# Patient Record
Sex: Female | Born: 2000 | Race: Asian | Hispanic: No | Marital: Single | Smoking: Never smoker
Health system: Southern US, Community
[De-identification: ages and names within clinical notes are randomized; demographics above are authoritative.]

## PROBLEM LIST (undated history)

## (undated) DIAGNOSIS — O24419 Gestational diabetes mellitus in pregnancy, unspecified control: Secondary | ICD-10-CM

## (undated) DIAGNOSIS — Z789 Other specified health status: Secondary | ICD-10-CM

## (undated) HISTORY — DX: Gestational diabetes mellitus in pregnancy, unspecified control: O24.419

## (undated) HISTORY — PX: NO PAST SURGERIES: SHX2092

---
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
## 2020-04-04 ENCOUNTER — Other Ambulatory Visit: Payer: Self-pay | Admitting: Physician Assistant

## 2020-10-04 IMAGING — US OBSTETRIC <14 WK US AND TRANSVAGINAL OB US
1 series · 15 of 28 positions shown · non-contrast
Comparison: None.

CLINICAL DATA: Heavy bleeding

EXAM:
OBSTETRIC <14 WK US AND TRANSVAGINAL OB US
TECHNIQUE: Both transabdominal and transvaginal ultrasound examinations were
performed for complete evaluation of the gestation as well as the
maternal uterus, adnexal regions, and pelvic cul-de-sac.
Transvaginal technique was performed to assess early pregnancy.

[Series 1: obstetric <14 wk us and transvaginal ob us · 74 acquisitions, 15 frames shown]
[im 1/74]
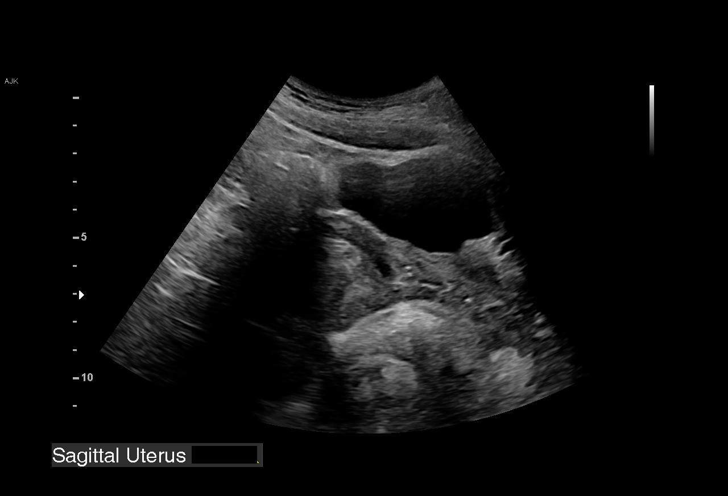
[im 6/74]
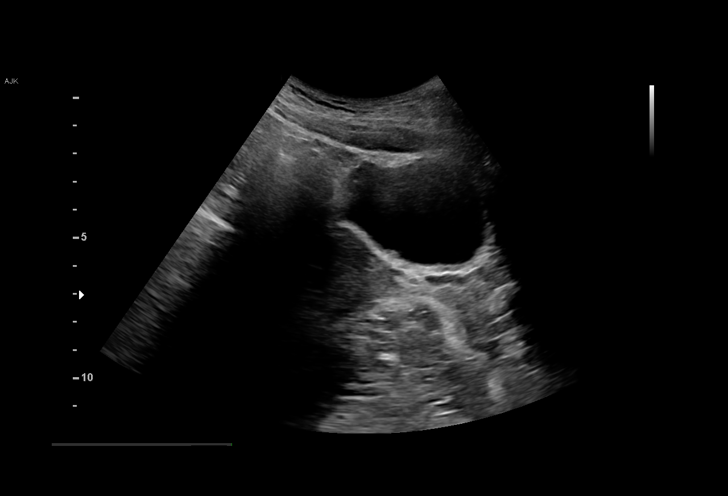
[im 11/74]
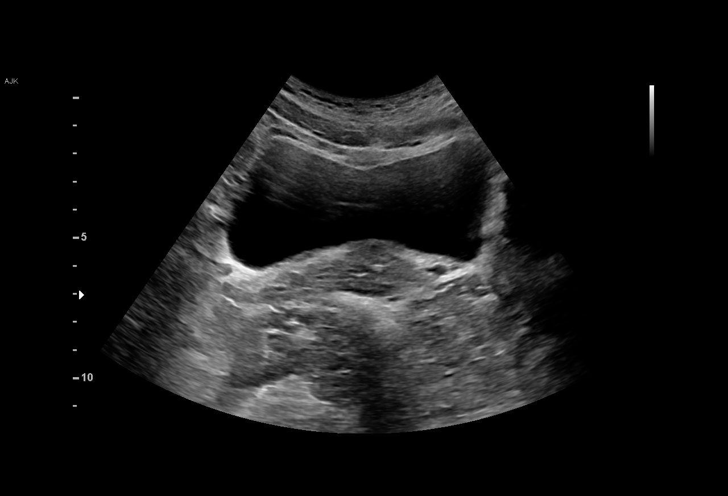
[im 17/74]
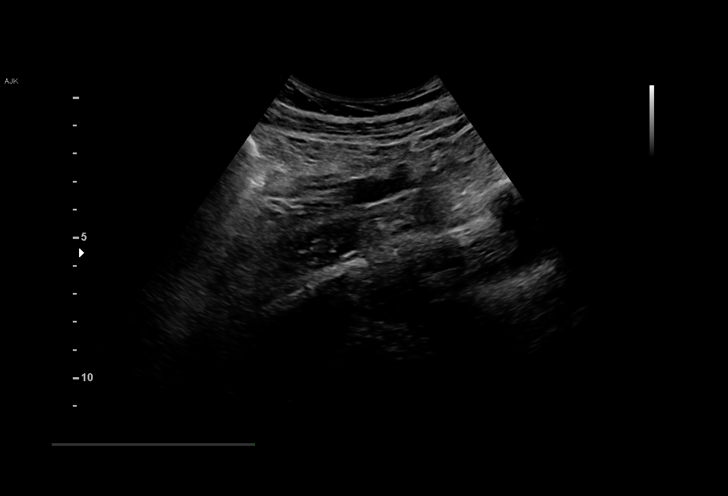
[im 22/74]
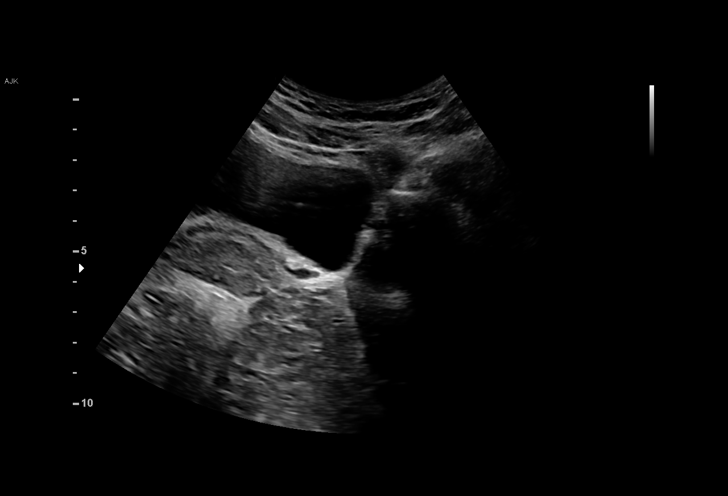
[im 28/74]
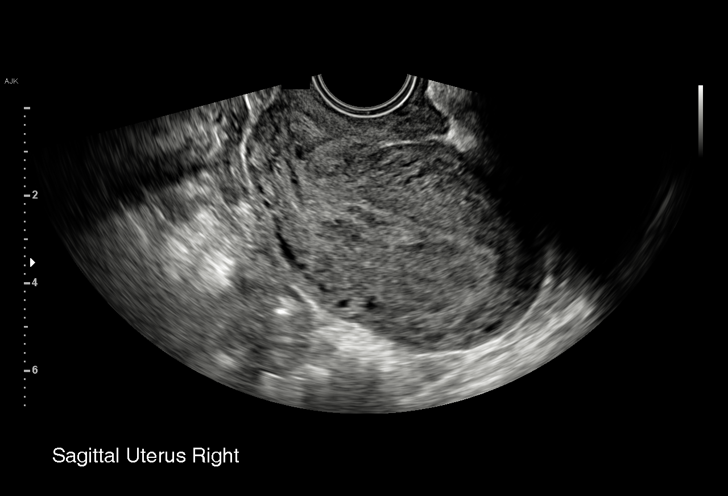
[im 33/74]
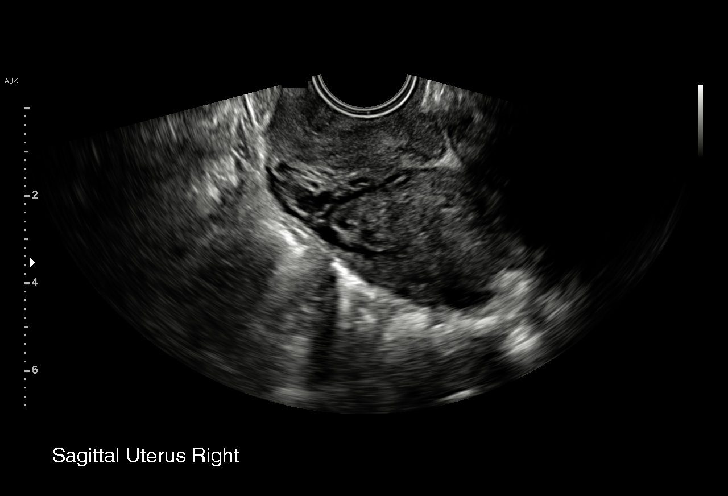
[im 38/74]
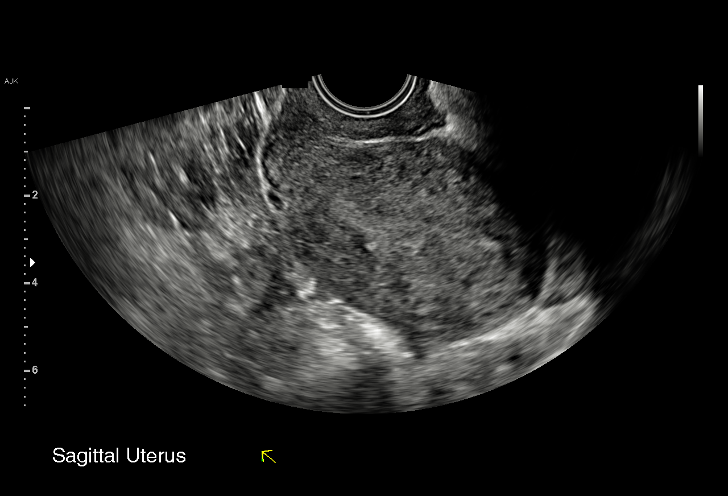
[im 41/74]
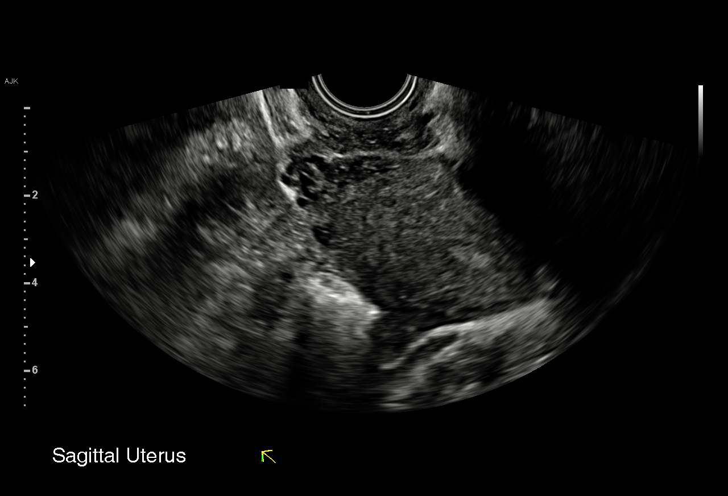
[im 46/74]
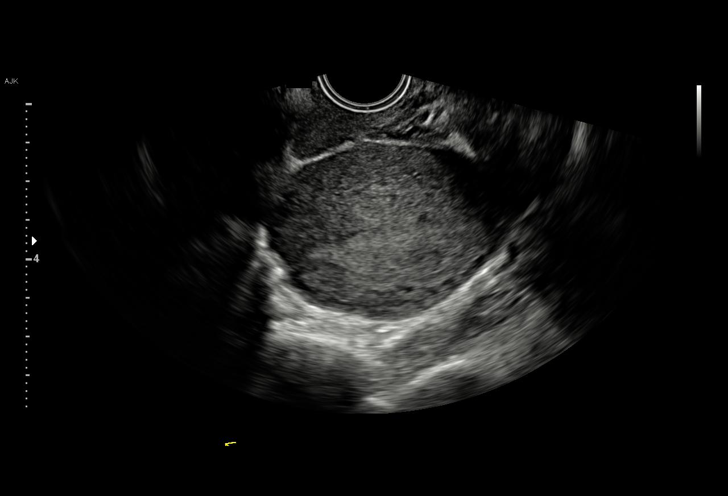
[im 52/74]
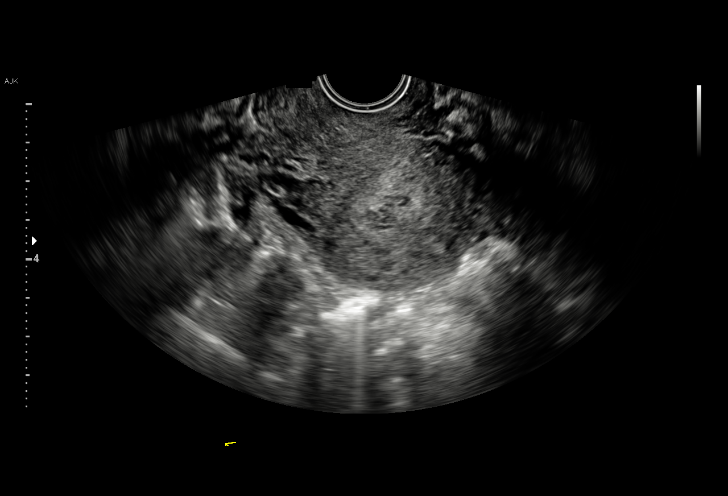
[im 57/74]
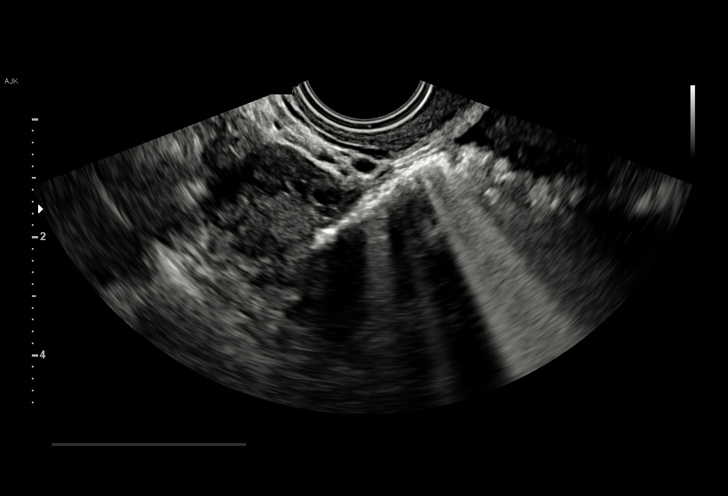
[im 63/74]
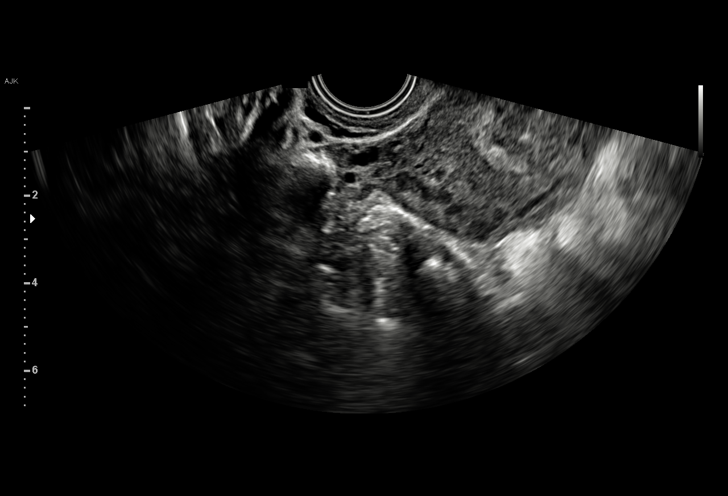
[im 68/74]
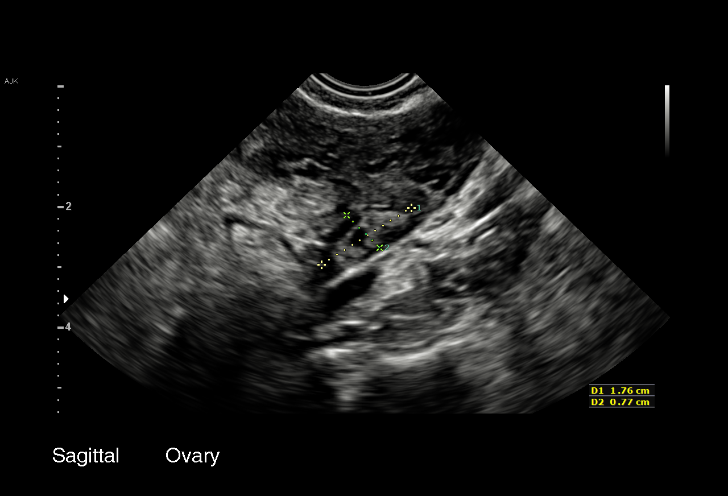
[im 74/74]
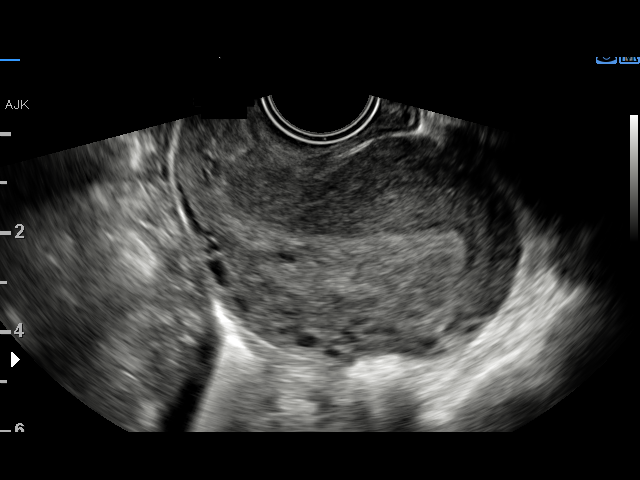

[15 of 28 positions shown; findings below may reference images not displayed]

FINDINGS: Intrauterine gestational sac: Absent

Maternal uterus/adnexae: Uterus is prominent with thickened
endometrium. Ovaries appear within normal limits.
IMPRESSION: No intrauterine gestation identified. Correlation with beta HCG
level is recommended.

## 2023-01-14 ENCOUNTER — Other Ambulatory Visit: Payer: Self-pay

## 2023-01-14 ENCOUNTER — Encounter: Payer: Self-pay | Admitting: Family Medicine

## 2023-01-14 ENCOUNTER — Ambulatory Visit: Payer: Medicaid Other | Admitting: Certified Nurse Midwife

## 2023-01-14 VITALS — BP 105/65 | HR 60 | Ht 60.0 in | Wt 151.0 lb

## 2023-01-14 DIAGNOSIS — Z3201 Encounter for pregnancy test, result positive: Secondary | ICD-10-CM | POA: Diagnosis not present

## 2023-01-14 DIAGNOSIS — Z3200 Encounter for pregnancy test, result unknown: Secondary | ICD-10-CM

## 2023-01-14 LAB — POCT PREGNANCY, URINE: Preg Test, Ur: POSITIVE — AB

## 2023-01-14 MED ORDER — PROMETHAZINE HCL 25 MG PO TABS: 25.0000 mg | ORAL_TABLET | Freq: Four times a day (QID) | ORAL | 0 refills | Status: DC | PRN

## 2023-01-14 MED ORDER — DOXYLAMINE-PYRIDOXINE 10-10 MG PO TBEC: 2.0000 | DELAYED_RELEASE_TABLET | Freq: Every day | ORAL | 0 refills | Status: DC

## 2023-01-14 NOTE — Patient Instructions (Signed)

## 2023-01-14 NOTE — Progress Notes (Signed)
Possible Pregnancy  Here today for pregnancy confirmation. UPT in office today is positive. Pt reports first positive home UPT on 12/31/22. Reviewed dating with patient:   LMP: Exact 11/29/2022 EDD: 09/05/2023 6w 4d today  OB history reviewed. Reviewed medications and allergies with patient; list of medications safe to take during pregnancy given.  Recommended pt begin prenatal vitamin and schedule prenatal care. Patient reports some n/v. Medications ordered per protocol. Advised patient to go to the [facility] if she experiences any heavy vaginal bleeding and/or severe abdominal pain.  Dating and viability [place] scheduled for 01/26/23 at 9:15 AM. The front office to schedule new OB intake.   Julia Reichert, RN 01/14/2023  8:29 AM

## 2023-01-26 ENCOUNTER — Other Ambulatory Visit: Payer: Self-pay

## 2023-01-26 ENCOUNTER — Ambulatory Visit: Payer: Medicaid Other

## 2023-01-26 DIAGNOSIS — Z3491 Encounter for supervision of normal pregnancy, unspecified, first trimester: Secondary | ICD-10-CM

## 2023-01-26 DIAGNOSIS — Z3200 Encounter for pregnancy test, result unknown: Secondary | ICD-10-CM

## 2023-01-26 DIAGNOSIS — Z3A01 Less than 8 weeks gestation of pregnancy: Secondary | ICD-10-CM

## 2023-01-29 ENCOUNTER — Encounter: Payer: Self-pay | Admitting: Obstetrics and Gynecology

## 2023-02-04 NOTE — L&D Delivery Note (Cosign Needed Addendum)
 OB/GYN Faculty Practice Delivery Note  Julia Bryan is a 23 y.o. G3P1011 s/p SVD at [redacted]w[redacted]d. She was admitted for IOL A2GDM.   ROM: 9h 76m with clear fluid GBS Status:  Negative/-- (07/15 1643) Maximum Maternal Temperature: 98.57F  Labor Progress: Initial SVE: 3/50/B. She received Pitocin  and AROM. She then progressed to complete.   Delivery Date/Time: 0146 7/28 Delivery: Called to room and patient was complete and pushing. Head delivered DOA. No nuchal cord present. Shoulder and body delivered in usual fashion. Infant with spontaneous cry, placed on mother's abdomen, dried and stimulated. Cord clamped x 2 after 1-minute delay, and cut by FOB. Cord blood drawn. Placenta delivered spontaneously with gentle cord traction. Fundus firm with massage and Pitocin . Labia, perineum, vagina, full fundal sweep (ordered for Ancef  2g once) and cervix inspected without laceration. Mom and baby doing well.  Baby Weight: pending  Placenta: 3 vessel, intact. Sent to L&D Complications: None Lacerations: None EBL: 108 mL Analgesia: Epidural   Infant:  APGAR (1 MIN): 9  APGAR (5 MINS): 9   [name], MD [facility] Family Medicine Fellow, [facility], [facility] 08/31/2023, 2:13 AM

## 2023-02-11 ENCOUNTER — Telehealth: Payer: Medicaid Other

## 2023-02-11 DIAGNOSIS — Z3491 Encounter for supervision of normal pregnancy, unspecified, first trimester: Secondary | ICD-10-CM

## 2023-02-11 DIAGNOSIS — Z3490 Encounter for supervision of normal pregnancy, unspecified, unspecified trimester: Secondary | ICD-10-CM | POA: Insufficient documentation

## 2023-02-11 DIAGNOSIS — Z3A09 9 weeks gestation of pregnancy: Secondary | ICD-10-CM

## 2023-02-11 MED ORDER — BLOOD PRESSURE KIT DEVI
1.0000 | 0 refills | Status: DC | PRN
Start: 2023-02-11 — End: 2023-09-01

## 2023-02-11 NOTE — Patient Instructions (Signed)

## 2023-02-11 NOTE — Progress Notes (Signed)
 New OB Intake  I connected with Julia Bryan  on 02/11/23 at  1:15 PM EST by MyChart Video Visit and verified that I am speaking with the correct person using two identifiers. Nurse is located at [facility] and pt is located at home.  I discussed the limitations, risks, security and privacy concerns of performing an evaluation and management service by telephone and the availability of in person appointments. I also discussed with the patient that there may be a patient responsible charge related to this service. The patient expressed understanding and agreed to proceed.  I explained I am completing New OB Intake today. We discussed EDD of 09/13/2023, by Ultrasound. Pt is G3P1011. I reviewed her allergies, medications and Medical/Surgical/OB history.    There are no active problems to display for this patient.   Concerns addressed today  Delivery Plans Plans to deliver at [facility]. Discussed the nature of our practice with multiple providers including residents and students. Due to the size of the practice, the delivering provider may not be the same as those providing prenatal care.   Patient is not interested in water birth. Offered upcoming OB visit with CNM to discuss further.  MyChart/Babyscripts MyChart access verified. I explained pt will have some visits in office and some virtually. Babyscripts instructions given and order placed. Patient verifies receipt of registration text/e-mail. Account successfully created and app downloaded. If patient is a candidate for Optimized scheduling, add to sticky note.   Blood Pressure Cuff/Weight Scale Blood pressure cuff ordered for patient to pick-up from [employer]. Explained after first prenatal appt pt will check weekly and document in Babyscripts.  Anatomy US  Explained first scheduled US  will be around 19 weeks. Anatomy US  scheduled for 04/20/2023 at 1:15pm.  Is patient a CenteringPregnancy candidate?  Declined Declined due to Declined to  say   Is patient a Mom+Baby Combined Care candidate?  Not a candidate   If accepted, confirm patient does not intend to move from the area for at least 12 months, then notify Mom+Baby staff  Interested in [place]? If yes, send referral and doula dot phrase.   Is patient a candidate for Babyscripts Optimization? Offer!   First visit review I reviewed new OB appt with patient. Explained pt will be seen by Dr. Cleatus at first visit. Discussed [place] genetic screening with patient. Panorama and Horizon.. Routine prenatal labs is needed at new ob visit.  Last Pap No results found for: DIAGPAP  Irene ONEIDA Lee, CMA 02/11/2023  1:04 PM

## 2023-02-14 NOTE — Progress Notes (Addendum)
 History:   Julia Bryan is a 23 y.o. G3P1011 at [redacted]w[redacted]d by 7 wk US  being seen today for her first obstetrical visit.   Patient  undecided  about breast feeding.   Pregnancy history fully reviewed. Obstetrical history is significant for FT SVD that was uncomplicated in 2021.   Patient reports no complaints.      HISTORY: OB History  Gravida Para Term Preterm AB Living  3 1 1  0 1 1  SAB IAB Ectopic Multiple Live Births  1 0 0 0 1    # Outcome Date GA Lbr Len/2nd Weight Sex Type Anes PTL Lv  3 Current           2 Term 11/27/19 [redacted]w[redacted]d 10:07 / 00:40 8 lb 0.2 oz (3.635 kg) F Vag-Spont EPI  LIV     Name: TANAIA, THISSELL     Apgar1: 9  Apgar5: 9  1 SAB 09/2018             Past Medical History:  Diagnosis Date   Maternal varicella, non-immune 11/28/2019   Medical history non-contributory    Past Surgical History:  Procedure Laterality Date   NO PAST SURGERIES     Family History  Problem Relation Age of Onset   Diabetes Mother    Social History   Tobacco Use   Smoking status: Never   Smokeless tobacco: Never  Vaping Use   Vaping status: Never Used  Substance Use Topics   Alcohol use: Never   Drug use: Never   No Known Allergies Current Outpatient Medications on File Prior to Visit  Medication Sig Dispense Refill   Doxylamine -Pyridoxine  (DICLEGIS ) 10-10 MG TBEC Take 2 tablets by mouth at bedtime. 60 tablet 0   Prenatal Vit-Fe Fumarate-FA (PRENATAL VITAMIN PO) Take by mouth.     Blood Pressure Monitoring (BLOOD PRESSURE KIT) DEVI 1 each by Does not apply route as needed. (Patient not taking: Reported on 02/18/2023) 1 each 0   No current facility-administered medications on file prior to visit.    Review of Systems Pertinent items noted in HPI and remainder of comprehensive ROS otherwise negative.  Physical Exam:   Vitals:   02/18/23 0943  BP: 114/74  Pulse: 84  Weight: 154 lb (69.9 kg)   Fetal Heart Rate (bpm): 166  General: well-developed, well-nourished  female in no acute distress  Breasts:  normal appearance, no masses or tenderness bilaterally  Skin: normal coloration and turgor, no rashes  Neurologic: oriented, normal, negative, normal mood  Extremities: normal strength, tone, and muscle mass, ROM of all joints is normal  HEENT PERRLA, extraocular movement intact and sclera clear, anicteric  Neck supple and no masses  Cardiovascular: regular rate and rhythm  Respiratory:  no respiratory distress, normal breath sounds  Abdomen: soft, non-tender; bowel sounds normal; no masses,  no organomegaly  Pelvic: normal external genitalia, no lesions, normal vaginal mucosa, normal vaginal discharge, normal cervix, pap smear done. Uterine size:  aga    Assessment:    Pregnancy: G3P1011 Patient Active Problem List   Diagnosis Date Noted   Supervision of low-risk pregnancy 02/11/2023     Plan:    1. Encounter for supervision of low-risk pregnancy in first trimester (Primary) Initial labs ordered. Pap done today.  Offered flu shot- pt accepts Continue prenatal vitamins. Has some eczema like lesion on right elbow. Will try topical steroid ointment if still present in one month, will refer to derm.  Problem list reviewed and updated. Genetic Screening discussed, NIPS:  ordered. Ultrasound discussed; fetal anatomic survey: Scheduled. Anticipatory guidance about prenatal visits given including labs, ultrasounds, and testing. Discussed usage of Babyscripts and virtual visits  2. Pregnancy with 10 completed weeks gestation   The nature of [facility]Faculty Practice with multiple MDs and Advanced Practice Providers was explained to patient; also emphasized that residents, students are part of our team. Routine obstetric precautions reviewed. Encouraged to seek out care at office or emergency room [facility] preferred) for urgent and/or emergent concerns. Return in about 4 weeks (around 03/18/2023) for OB VISIT, MD or  APP.    Lacey Pian, MD, FACOG Obstetrician & Gynecologist, [facility] for [facility], [facility]

## 2023-02-18 ENCOUNTER — Ambulatory Visit (INDEPENDENT_AMBULATORY_CARE_PROVIDER_SITE_OTHER): Payer: Medicaid Other | Admitting: Obstetrics and Gynecology

## 2023-02-18 ENCOUNTER — Encounter: Payer: Self-pay | Admitting: Family Medicine

## 2023-02-18 ENCOUNTER — Encounter: Payer: Self-pay | Admitting: General Practice

## 2023-02-18 ENCOUNTER — Other Ambulatory Visit (HOSPITAL_COMMUNITY)
Admission: RE | Admit: 2023-02-18 | Discharge: 2023-02-18 | Disposition: A | Discharge status: Home / Self Care | Payer: Medicaid Other | Source: Ambulatory Visit | Attending: Obstetrics and Gynecology | Admitting: Obstetrics and Gynecology

## 2023-02-18 ENCOUNTER — Other Ambulatory Visit: Payer: Self-pay

## 2023-02-18 VITALS — BP 114/74 | HR 84 | Wt 154.0 lb

## 2023-02-18 DIAGNOSIS — Z3491 Encounter for supervision of normal pregnancy, unspecified, first trimester: Secondary | ICD-10-CM | POA: Insufficient documentation

## 2023-02-18 DIAGNOSIS — L2082 Flexural eczema: Secondary | ICD-10-CM | POA: Diagnosis not present

## 2023-02-18 DIAGNOSIS — Z23 Encounter for immunization: Secondary | ICD-10-CM | POA: Diagnosis not present

## 2023-02-18 DIAGNOSIS — Z3A10 10 weeks gestation of pregnancy: Secondary | ICD-10-CM

## 2023-02-18 MED ORDER — TRIAMCINOLONE ACETONIDE 0.5 % EX OINT: 1.0000 | TOPICAL_OINTMENT | Freq: Two times a day (BID) | CUTANEOUS | 0 refills | Status: DC

## 2023-02-18 NOTE — Addendum Note (Signed)
 Addended by: Lacey Pian A on: 02/18/2023 10:34 AM   Modules accepted: Orders

## 2023-02-19 ENCOUNTER — Encounter: Payer: Self-pay | Admitting: Obstetrics and Gynecology

## 2023-02-19 DIAGNOSIS — R7309 Other abnormal glucose: Secondary | ICD-10-CM | POA: Insufficient documentation

## 2023-02-19 DIAGNOSIS — O24419 Gestational diabetes mellitus in pregnancy, unspecified control: Secondary | ICD-10-CM | POA: Insufficient documentation

## 2023-02-19 DIAGNOSIS — Z8632 Personal history of gestational diabetes: Secondary | ICD-10-CM | POA: Insufficient documentation

## 2023-02-19 LAB — CBC/D/PLT+RPR+RH+ABO+RUBIGG...
Antibody Screen: NEGATIVE
Basophils Absolute: 0 10*3/uL (ref 0.0–0.2)
Basos: 1 %
EOS (ABSOLUTE): 0.5 10*3/uL — ABNORMAL HIGH (ref 0.0–0.4)
Eos: 6 %
HCV Ab: NONREACTIVE
HIV Screen 4th Generation wRfx: NONREACTIVE
Hematocrit: 38.3 % (ref 34.0–46.6)
Hemoglobin: 12.5 g/dL (ref 11.1–15.9)
Hepatitis B Surface Ag: NEGATIVE
Immature Grans (Abs): 0 10*3/uL (ref 0.0–0.1)
Immature Granulocytes: 1 %
Lymphocytes Absolute: 1.3 10*3/uL (ref 0.7–3.1)
Lymphs: 15 %
MCH: 28 pg (ref 26.6–33.0)
MCHC: 32.6 g/dL (ref 31.5–35.7)
MCV: 86 fL (ref 79–97)
Monocytes Absolute: 0.5 10*3/uL (ref 0.1–0.9)
Monocytes: 6 %
Neutrophils Absolute: 6.4 10*3/uL (ref 1.4–7.0)
Neutrophils: 71 %
Platelets: 413 10*3/uL (ref 150–450)
RBC: 4.47 x10E6/uL (ref 3.77–5.28)
RDW: 12.2 % (ref 11.7–15.4)
RPR Ser Ql: NONREACTIVE
Rh Factor: POSITIVE
Rubella Antibodies, IGG: 1.4 {index} (ref >0.99)
WBC: 8.8 10*3/uL (ref 3.4–10.8)

## 2023-02-19 LAB — HCV INTERPRETATION

## 2023-02-19 LAB — HEMOGLOBIN A1C
Est. average glucose Bld gHb Est-mCnc: 117 mg/dL
Hgb A1c MFr Bld: 5.7 % — ABNORMAL HIGH (ref 4.8–5.6)

## 2023-02-20 ENCOUNTER — Telehealth: Payer: Self-pay

## 2023-02-20 DIAGNOSIS — R7309 Other abnormal glucose: Secondary | ICD-10-CM

## 2023-02-20 LAB — CYTOLOGY - PAP
Chlamydia: NEGATIVE
Comment: NEGATIVE
Comment: NORMAL
Diagnosis: NEGATIVE
Neisseria Gonorrhea: NEGATIVE

## 2023-02-20 NOTE — Telephone Encounter (Addendum)
-----   Message from Milas Hock sent at 02/19/2023  4:20 PM EST ----- Pleas arrange for early 2 hr. Thanks, pad  Pt notified and agreed to come in on 02/23/23 for early 2 hr.  Pt advised to come in fasting.  Pt verbalized understanding.   Addison Naegeli, RN  02/20/23

## 2023-02-23 ENCOUNTER — Encounter: Payer: Self-pay | Admitting: *Deleted

## 2023-02-23 ENCOUNTER — Other Ambulatory Visit: Payer: Self-pay

## 2023-02-23 ENCOUNTER — Other Ambulatory Visit: Payer: Medicaid Other

## 2023-02-23 DIAGNOSIS — R7309 Other abnormal glucose: Secondary | ICD-10-CM

## 2023-02-24 ENCOUNTER — Encounter: Payer: Self-pay | Admitting: Obstetrics and Gynecology

## 2023-02-24 LAB — GLUCOSE TOLERANCE, 2 HOURS W/ 1HR
Glucose, 1 hour: 187 mg/dL — ABNORMAL HIGH (ref 70–179)
Glucose, 2 hour: 140 mg/dL (ref 70–152)
Glucose, Fasting: 84 mg/dL (ref 70–91)

## 2023-02-25 ENCOUNTER — Encounter: Payer: Self-pay | Admitting: *Deleted

## 2023-02-25 ENCOUNTER — Other Ambulatory Visit: Payer: Self-pay | Admitting: *Deleted

## 2023-02-25 DIAGNOSIS — O24419 Gestational diabetes mellitus in pregnancy, unspecified control: Secondary | ICD-10-CM

## 2023-02-25 MED ORDER — ACCU-CHEK SOFTCLIX LANCETS MISC: 12 refills | Status: DC

## 2023-02-25 MED ORDER — ACCU-CHEK GUIDE TEST VI STRP: ORAL_STRIP | 12 refills | Status: DC

## 2023-02-25 MED ORDER — ACCU-CHEK GUIDE W/DEVICE KIT: PACK | 0 refills | Status: DC

## 2023-02-26 ENCOUNTER — Encounter: Payer: Self-pay | Admitting: Obstetrics and Gynecology

## 2023-02-26 LAB — PANORAMA PRENATAL TEST FULL PANEL:PANORAMA TEST PLUS 5 ADDITIONAL MICRODELETIONS: FETAL FRACTION: 5.4

## 2023-03-03 LAB — HORIZON CUSTOM: REPORT SUMMARY: POSITIVE — AB

## 2023-03-04 ENCOUNTER — Encounter: Payer: Self-pay | Admitting: Obstetrics and Gynecology

## 2023-03-04 DIAGNOSIS — D563 Thalassemia minor: Secondary | ICD-10-CM | POA: Insufficient documentation

## 2023-03-05 ENCOUNTER — Other Ambulatory Visit: Payer: Self-pay | Admitting: *Deleted

## 2023-03-05 ENCOUNTER — Other Ambulatory Visit: Payer: Self-pay

## 2023-03-05 ENCOUNTER — Encounter: Payer: Medicaid Other | Attending: Obstetrics and Gynecology | Admitting: Dietician

## 2023-03-05 ENCOUNTER — Ambulatory Visit: Payer: Medicaid Other | Admitting: Dietician

## 2023-03-05 ENCOUNTER — Encounter: Payer: Self-pay | Admitting: *Deleted

## 2023-03-05 DIAGNOSIS — Z713 Dietary counseling and surveillance: Secondary | ICD-10-CM | POA: Insufficient documentation

## 2023-03-05 DIAGNOSIS — O24419 Gestational diabetes mellitus in pregnancy, unspecified control: Secondary | ICD-10-CM | POA: Insufficient documentation

## 2023-03-05 DIAGNOSIS — Z3A12 12 weeks gestation of pregnancy: Secondary | ICD-10-CM

## 2023-03-05 DIAGNOSIS — O285 Abnormal chromosomal and genetic finding on antenatal screening of mother: Secondary | ICD-10-CM

## 2023-03-05 NOTE — Progress Notes (Signed)
Patient was seen for Gestational Diabetes/Pre-existing Diabetes During Pregnancy) on 03/05/2023  Start time 0830 and End time 0910   Estimated due date: 09/13/2023; [redacted]w[redacted]d   Clinical: Medications:  Current Outpatient Medications:    Blood Glucose Monitoring Suppl (ACCU-CHEK GUIDE) w/Device KIT, Use 4 times daily as directed, Disp: 1 kit, Rfl: 0   Blood Pressure Monitoring (BLOOD PRESSURE KIT) DEVI, 1 each by Does not apply route as needed., Disp: 1 each, Rfl: 0   Prenatal Vit-Fe Fumarate-FA (PRENATAL VITAMIN PO), Take by mouth., Disp: , Rfl:    Accu-Chek Softclix Lancets lancets, Use 4 times daily as instructed, Disp: 100 each, Rfl: 12   Doxylamine-Pyridoxine (DICLEGIS) 10-10 MG TBEC, Take 2 tablets by mouth at bedtime. (Patient not taking: Reported on 03/05/2023), Disp: 60 tablet, Rfl: 0   glucose blood (ACCU-CHEK GUIDE TEST) test strip, Use 4 times daily as instructed, Disp: 100 each, Rfl: 12   triamcinolone ointment (KENALOG) 0.5 %, Apply 1 Application topically 2 (two) times daily. Twice daily until resolves (Patient not taking: Reported on 03/05/2023), Disp: 30 g, Rfl: 0  Medical History:  Past Medical History:  Diagnosis Date   Maternal varicella, non-immune 11/28/2019   Medical history non-contributory     Labs: OGTT fasting 84, 1 hour 187 mg/dL, 2 hour 161 mg/dL  Lab Results  Component Value Date   HGBA1C 5.7 (H) 02/18/2023    Dietary and Lifestyle History: Pt present today with her daughter. Pt denies previous gestational diabetes. Pt states she has glucometer and supplies at home. 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 Pt reports daily intake of juice stating unknown amount. All Pt's questions were answered during this encounter.    Physical Activity: dancing 4-5 times ~60 minutes Stress: 2 out of 10 /self care includes meditate time alone Sleep: "great"  24 hr Recall:  First Meal: skips or bagel with cream cheese or ~ handful cheerios with whole milk Snack:  humus, pita chips, cucumbers, pickles or rice crackers, water Second meal: noodles, red sauce, spinach, chicken, bell peppers,  Snack: none  Third meal: rice, black beans, steak or chicken, corn salsa, tomatoes, cilantro, cheese, water Snack: none Beverages: water, whole milk, orange juice  NUTRITION INTERVENTION  Nutrition education (E-1) on the following topics:   Initial Follow-up  [x]  []  Definition of Gestational Diabetes [x]  []  Why dietary management is important in controlling blood glucose [x]  []  Effects each nutrient has on blood glucose levels [x]  []  Simple carbohydrates vs complex carbohydrates [x]  []  Fluid intake [x]  []  Creating a balanced meal plan [x]  []  Carbohydrate counting  [x]  []  When to check blood glucose levels [x]  []  Proper blood glucose monitoring techniques [x]  []  Effect of stress and stress reduction techniques  [x]  []  Exercise effect on blood glucose levels, appropriate exercise during pregnancy [x]  []  Importance of limiting caffeine and abstaining from alcohol and smoking [x]  []  Medications used for blood sugar control during pregnancy [x]  []  Hypoglycemia and rule of 15 [x]  []  Postpartum self care   Patient has a meter prior to visit. Patient is instructed to begin testing pre breakfast and 2 hours after each meal. CBG: 91 mg/dL, reported as fasting per Pt.    Patient instructed to monitor glucose levels: QID FBS: 60 - <= 95 mg/dL; 2 hour: <= 096 mg/dL  Patient received handouts: Nutrition Diabetes and Pregnancy Carbohydrate Counting List Blood glucose log Snack ideas for diabetes during pregnancy  Patient will be seen for follow-up as needed.

## 2023-03-16 ENCOUNTER — Other Ambulatory Visit: Payer: Self-pay

## 2023-03-16 ENCOUNTER — Ambulatory Visit (INDEPENDENT_AMBULATORY_CARE_PROVIDER_SITE_OTHER): Payer: Medicaid Other | Admitting: Obstetrics and Gynecology

## 2023-03-16 VITALS — BP 111/58 | HR 74 | Wt 154.3 lb

## 2023-03-16 DIAGNOSIS — D563 Thalassemia minor: Secondary | ICD-10-CM

## 2023-03-16 DIAGNOSIS — Z3A14 14 weeks gestation of pregnancy: Secondary | ICD-10-CM

## 2023-03-16 DIAGNOSIS — O24419 Gestational diabetes mellitus in pregnancy, unspecified control: Secondary | ICD-10-CM | POA: Diagnosis not present

## 2023-03-16 DIAGNOSIS — Z3491 Encounter for supervision of normal pregnancy, unspecified, first trimester: Secondary | ICD-10-CM

## 2023-03-16 NOTE — Progress Notes (Signed)
   PRENATAL VISIT NOTE  Subjective:  Julia Bryan is a 23 y.o. G3P1011 at [redacted]w[redacted]d being seen today for ongoing prenatal care.  She is currently monitored for the following issues for this low-risk pregnancy and has Supervision of low-risk pregnancy; Gestational diabetes; and Alpha thalassemia silent carrier on their problem list.  Patient reports no complaints.   . Vag. Bleeding: None.  Movement: Absent. Denies leaking of fluid.   The following portions of the patient's history were reviewed and updated as appropriate: allergies, current medications, past family history, past medical history, past social history, past surgical history and problem list.   Objective:   Vitals:   03/16/23 1137  BP: (!) 111/58  Pulse: 74  Weight: 154 lb 4.8 oz (70 kg)    Fetal Status: Fetal Heart Rate (bpm): 159   Movement: Absent     General:  Alert, oriented and cooperative. Patient is in no acute distress.  Skin: Skin is warm and dry. No rash noted.   Cardiovascular: Normal heart rate noted  Respiratory: Normal respiratory effort, no problems with respiration noted  Abdomen: Soft, gravid, appropriate for gestational age.  Pain/Pressure: Absent     Pelvic: Cervical exam deferred        Extremities: Normal range of motion.     Mental Status: Normal mood and affect. Normal behavior. Normal judgment and thought content.   Assessment and Plan:  Pregnancy: G3P1011 at [redacted]w[redacted]d 1. Encounter for supervision of low-risk pregnancy in first trimester (Primary) BP and FHR normal Doing well, not feeling moving yet   2. Gestational diabetes mellitus (GDM), antepartum, gestational diabetes method of control unspecified Didn't realize she needed to bring log today Reports fastings 90-93, pp 115-120 (some 125-130, but those were 1 hour after meals Met with diabetic education 1/30 Anatomy scan 3/17  3. [redacted] weeks gestation of pregnancy   4. Alpha thalassemia silent carrier Meeting with genetics after u/s  3/17   Preterm labor symptoms and general obstetric precautions including but not limited to vaginal bleeding, contractions, leaking of fluid and fetal movement were reviewed in detail with the patient. Please refer to After Visit Summary for other counseling recommendations.   Return in about 4 weeks (around 04/13/2023) for OB VISIT (MD or APP).  Future Appointments  Date Time Provider Department Center  04/20/2023  1:15 PM [facility] NURSE [facility]  04/20/2023  1:30 PM [facility] US1 [facility]  04/20/2023  2:30 PM [facility] GENETIC COUNSELING RM [facility]    Susi Eric, FNP

## 2023-03-16 NOTE — Progress Notes (Signed)
 Pt was not aware to bring Glucose log to every visit

## 2023-04-13 ENCOUNTER — Encounter: Payer: Medicaid Other | Admitting: Family Medicine

## 2023-04-15 ENCOUNTER — Other Ambulatory Visit: Payer: Self-pay

## 2023-04-15 ENCOUNTER — Ambulatory Visit: Admitting: Certified Nurse Midwife

## 2023-04-15 VITALS — BP 105/54 | HR 91 | Wt 159.5 lb

## 2023-04-15 DIAGNOSIS — O24410 Gestational diabetes mellitus in pregnancy, diet controlled: Secondary | ICD-10-CM | POA: Diagnosis not present

## 2023-04-15 DIAGNOSIS — D563 Thalassemia minor: Secondary | ICD-10-CM

## 2023-04-15 DIAGNOSIS — Z3492 Encounter for supervision of normal pregnancy, unspecified, second trimester: Secondary | ICD-10-CM

## 2023-04-15 DIAGNOSIS — Z3A18 18 weeks gestation of pregnancy: Secondary | ICD-10-CM | POA: Diagnosis not present

## 2023-04-15 NOTE — Progress Notes (Signed)
   PRENATAL VISIT NOTE  Subjective:  Julia Bryan is a 23 y.o. G3P1011 at [redacted]w[redacted]d being seen today for ongoing prenatal care.  She is currently monitored for the following issues for this low-risk pregnancy and has Supervision of high risk pregnancy; Gestational diabetes; and Alpha thalassemia silent carrier on their problem list.  Patient reports no complaints.  Contractions: Not present. Vag. Bleeding: None.  Movement: Present. Denies leaking of fluid.   The following portions of the patient's history were reviewed and updated as appropriate: allergies, current medications, past family history, past medical history, past social history, past surgical history and problem list.   Objective:   Vitals:   04/15/23 1429  BP: (!) 105/54  Pulse: 91  Weight: 72.3 kg    Fetal Status: Fetal Heart Rate (bpm): 155   Movement: Present     General:  Alert, oriented and cooperative. Patient is in no acute distress.  Skin: Skin is warm and dry. No rash noted.   Cardiovascular: Normal heart rate noted  Respiratory: Normal respiratory effort, no problems with respiration noted  Abdomen: Soft, gravid, appropriate for gestational age.  Pain/Pressure: Absent     Pelvic: Cervical exam deferred        Extremities: Normal range of motion.  Edema: None  Mental Status: Normal mood and affect. Normal behavior. Normal judgment and thought content.   Assessment and Plan:  Pregnancy: G3P1011 at [redacted]w[redacted]d 1. Encounter for supervision of low-risk pregnancy in second trimester (Primary) -Patient reports doing well. No concerns.  2. [redacted] weeks gestation of pregnancy -Routine OB care -AFP collected  3. Diet controlled gestational diabetes mellitus (GDM) in second trimester -Discussed importance of eating about every 3 hours day for better glycemic control. Recommended 3 meals/day with protein and good carbs (like fruit, veggies, nuts, and beans) and healthy snacks in between.   4. Alpha thalassemia silent  carrier -Normal Hgb -Carrier screening kit given for partner   Preterm labor symptoms and general obstetric precautions including but not limited to vaginal bleeding, contractions, leaking of fluid and fetal movement were reviewed in detail with the patient. Please refer to After Visit Summary for other counseling recommendations.   Follow up in 4 weeks.  Future Appointments  Date Time Provider Department Center  04/20/2023  1:15 PM [facility] NURSE [facility]  04/20/2023  1:30 PM [facility] US1 [facility]  04/20/2023  2:30 PM [facility] GENETIC COUNSELING RM [facility]  05/13/2023  1:15 PM Warden Fillers, MD [facility]    Elige Ko, Student-MidWife

## 2023-04-17 LAB — AFP, SERUM, OPEN SPINA BIFIDA
AFP MoM: 1.6
AFP Value: 68.9 ng/mL
Gest. Age on Collection Date: 18.3 wk
Maternal Age At EDD: 22.9 a
OSBR Risk 1 IN: 2100
Test Results:: NEGATIVE
Weight: 160 [lb_av]

## 2023-04-20 ENCOUNTER — Other Ambulatory Visit: Payer: Self-pay | Admitting: *Deleted

## 2023-04-20 ENCOUNTER — Ambulatory Visit: Payer: Medicaid Other | Attending: Obstetrics and Gynecology

## 2023-04-20 ENCOUNTER — Ambulatory Visit (HOSPITAL_BASED_OUTPATIENT_CLINIC_OR_DEPARTMENT_OTHER): Payer: Medicaid Other

## 2023-04-20 ENCOUNTER — Ambulatory Visit: Admitting: Maternal & Fetal Medicine

## 2023-04-20 ENCOUNTER — Ambulatory Visit: Payer: Medicaid Other | Admitting: *Deleted

## 2023-04-20 ENCOUNTER — Other Ambulatory Visit: Payer: Self-pay

## 2023-04-20 ENCOUNTER — Encounter: Payer: Self-pay | Admitting: Obstetrics and Gynecology

## 2023-04-20 ENCOUNTER — Encounter: Payer: Self-pay | Admitting: *Deleted

## 2023-04-20 DIAGNOSIS — O99012 Anemia complicating pregnancy, second trimester: Secondary | ICD-10-CM | POA: Diagnosis not present

## 2023-04-20 DIAGNOSIS — O285 Abnormal chromosomal and genetic finding on antenatal screening of mother: Secondary | ICD-10-CM | POA: Diagnosis present

## 2023-04-20 DIAGNOSIS — Z3A19 19 weeks gestation of pregnancy: Secondary | ICD-10-CM | POA: Insufficient documentation

## 2023-04-20 DIAGNOSIS — D563 Thalassemia minor: Secondary | ICD-10-CM

## 2023-04-20 DIAGNOSIS — O24410 Gestational diabetes mellitus in pregnancy, diet controlled: Secondary | ICD-10-CM | POA: Diagnosis present

## 2023-04-20 NOTE — Progress Notes (Signed)
 Patient information  Patient Name: Julia Bryan  Patient MRN:   [id number]  Referring practice: MFM Referring Provider: [facility])  MFM CONSULT  EWELINA NAVES is a 23 y.o. G3P1011 at [redacted]w[redacted]d here for ultrasound and consultation. Patient Active Problem List   Diagnosis Date Noted   Alpha thalassemia silent carrier 03/04/2023   Gestational diabetes 02/19/2023   Supervision of high risk pregnancy 02/11/2023   [facility] Knoop has a pregnancy with the complications mentioned in the problem list. During today's visit we focused on the following concerns:   RE early onset gestational diabetes: Diagnosed with a failed 2-hour glucose challenge test.  She reports that nearly all of her blood sugars are well-controlled.  She denies hypoglycemic episodes.  I discussed the estimated fetal weight is at the upper limit today but she is very early in the pregnancy.  I discussed the complications associated with gestational diabetes and the need for proper glycemic control.  We discussed the importance of serial growth ultrasounds.  Medication is reserved if diet and exercise alone cannot achieve ideal glucose results.  At this time no medication is indicated.  I discussed that since her A1c is less than 6 she does not need a fetal echo.  RE Alpha thalassemia silent carrier: Genetic counseling to follow today's visit.  Sonographic findings Single intrauterine pregnancy at 19w 1d. Fetal cardiac activity:  Observed and appears normal. Presentation: Cephalic. The anatomic structures that were well seen appear normal without evidence of soft markers. The anatomic survey is complete.  Fetal biometry shows the estimated fetal weight at the 99 percentile. Amniotic fluid: Within normal limits.  MVP: 4.83 cm. Placenta: Posterior. Adnexa: No adnexal mass visualized. Cervical length: 3.6 cm.  There are limitations of prenatal ultrasound such as the inability to detect certain abnormalities  due to poor visualization. Various factors such as fetal position, gestational age and maternal body habitus may increase the difficulty in visualizing the fetal anatomy.    Recommendations -EDD should be 09/13/2023 based on  U/S C R L  (01/26/23). -Serial growth ultrasounds every 5 weeks -Delivery timing pending clinical course  -Continue routine prenatal care with referring OB provider  Review of Systems: A review of systems was performed and was negative except per HPI   Vitals and Physical Exam    04/20/2023    1:35 PM 04/15/2023    2:29 PM 03/16/2023   11:37 AM  Vitals with BMI  Weight  159 lbs 8 oz 154 lbs 5 oz  Systolic 110 105 409  Diastolic 59 54 58  Pulse 91 91 74    Sitting comfortably on the sonogram table Nonlabored breathing Normal rate and rhythm Abdomen is nontender  Past pregnancies OB History  Gravida Para Term Preterm AB Living  3 1 1  0 1 1  SAB IAB Ectopic Multiple Live Births  1 0 0 0 1    # Outcome Date GA Lbr Len/2nd Weight Sex Type Anes PTL Lv  3 Current           2 Term 11/27/19 [redacted]w[redacted]d 10:07 / 00:40 8 lb 0.2 oz (3.635 kg) F Vag-Spont EPI  LIV  1 SAB 09/2018            I spent 30 minutes reviewing the patients chart, including labs and images as well as counseling the patient about her medical conditions. Greater than 50% of the time was spent in direct face-to-face patient counseling.  Braxton Feathers, DO Maternal  fetal medicine, [place]   04/20/2023  2:48 PM

## 2023-04-20 NOTE — Progress Notes (Cosign Needed Addendum)
 [facility] for Maternal Fetal Care at [facility] [address] Phone:  [phone]   Fax:  [phone]      In-Person Genetic Counseling Clinic Note:   I spoke with 23 y.o. Julia Bryan today to discuss her carrier screening results. She was referred by Julia Hock, MD. She was accompanied by her husband Julia Bryan.   Pregnancy History:    G3P1011. EGA: [redacted]w[redacted]d by [place]. EDD: 09/13/2023. Reports gestational diabetes. Denies personal history of high blood pressure, thyroid conditions, and seizures. Denies bleeding, infections, and fevers in this pregnancy. Denies using tobacco, alcohol, or street drugs in this pregnancy.   Family History:    A three-generation pedigree was created and scanned into Epic under the Media tab.  Maternal ethnicity reported as [place] and paternal ethnicity reported as [place]. Denies Ashkenazi Jewish ancestry.  Family history not remarkable for consanguinity, individuals with birth defects, intellectual disability, autism spectrum disorder, multiple spontaneous abortions, still births, or unexplained neonatal death.   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 She screened negative for the other three conditions (CF, SMA, and beta-hemoglobinopathies). Please see report for details.  We reviewed the genetics of alpha thalassemia, autosomal recessive mode of inheritance, and clinical features of these conditions. We reviewed that [facility] will either pass down two copies of the alpha globin gene (??) OR one copy of the alpha globin gene (-?) in each pregnancy. Therefore, this pregnancy is not at increased risk for hemoglobin Bart's due to four deletions of the alpha globin genes (--/--) regardless of her reproductive partner's carrier status. The pregnancy will be at increased risk (25%) for hemoglobin H  disease if her partner is an alpha thalassemia carrier in the cis configuration (--/??). We reviewed that this is more common in [place] Asian populations. Please note that there are point mutations, such as the [place] mutation, that may be detected through carrier screening. The clinical symptoms of these point mutations will be dependent on the genotype.  Given these results, we discussed and offered carrier screening for Julia Bryan's reproductive partner. We reviewed the benefits and limitations of carrier screening and that it can detect most but not all carriers.  The couple reported that they sent the Julia Bryan saliva kit for FOB earlier today, and we will monitor for the results. The couple requested we call Julia Bryan with the results. We reviewed that if both were found to be carriers, prenatal diagnosis through amniocentesis  would be available. We reviewed the technical aspects, benefits, risks, and limitations of  including the 1 in 500 risk for miscarriage.  Alternatively, testing can be completed postnatally. Of note, alpha thalassemia is not included on Julia Bryan's Newborn Screening (NBS) program.   Newborn Screening. The [place] Newborn Screening (NBS) program will screen all newborn babies for cystic fibrosis, spinal muscular atrophy, hemoglobinopathies, and numerous other conditions.  Educational Resources:  [employer]: DetoxShock.at MedlinePlus: RentRule.com.au  Previous Testing Completed:  Low risk NIPS: Julia Bryan previously completed noninvasive prenatal screening (NIPS) in this pregnancy. The result is low risk, consistent with a female fetus. This screening significantly reduces but does not eliminate the chance that the current pregnancy has Down syndrome (trisomy 58), trisomy 48, trisomy 55, common sex chromosome conditions, and 22q11.2 microdeletion  syndrome. Please see report for details. There are many genetic conditions that cannot be detected by  NIPS.   Negative ms-AFP screening: Julia Bryan previously completed a maternal serum AFP screen in this pregnancy. The result is screen negative. Please see report for details. A negative result reduces the risk that the current pregnancy has an open neural tube defect. Closed neural tube defects and some open defects may not be detected by this screen.   Plan of Care:   FOB Julia Bryan carrier screening is pending. We will call Julia Bryan with the results. Routine prenatal care.   Informed consent was obtained. All questions were answered.   40 minutes were spent on the date of the encounter in service to the patient including preparation, face-to-face consultation, discussion of test reports and available next steps, pedigree construction, genetic risk assessment, documentation, and care coordination.    Thank you for sharing in the care of Julia Bryan with [place].  Please do not hesitate to contact us at [phone] if you have any questions.   Sheppard Plumber, MS, [facility] Certified Genetic Counselor   Genetic counseling student involved in appointment: Yes Julia Bryan).;  I provided general supervision for this patient and was immediately available for any patient care concerns. [employer]

## 2023-05-08 ENCOUNTER — Ambulatory Visit: Payer: Self-pay

## 2023-05-11 ENCOUNTER — Telehealth (HOSPITAL_BASED_OUTPATIENT_CLINIC_OR_DEPARTMENT_OTHER): Payer: Self-pay

## 2023-05-11 DIAGNOSIS — D563 Thalassemia minor: Secondary | ICD-10-CM

## 2023-05-11 DIAGNOSIS — O99012 Anemia complicating pregnancy, second trimester: Secondary | ICD-10-CM | POA: Diagnosis not present

## 2023-05-11 DIAGNOSIS — D582 Other hemoglobinopathies: Secondary | ICD-10-CM | POA: Diagnosis not present

## 2023-05-11 DIAGNOSIS — Z3A22 22 weeks gestation of pregnancy: Secondary | ICD-10-CM

## 2023-05-11 NOTE — Telephone Encounter (Cosign Needed)
 I called the patient to discuss her partner's carrier screening results Chauncey Fischer Y DOB 06-20-2000). She shared that he was present on the call. 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  Alpha thalassemia: FOB was found to be a carrier for alpha thalassemia in the trans configuration (-a/-a), as he carries two pathogenic 3.7 deletions in his HBA2 genes. We reviewed that since [facility] is a silent carrier due to the 3.7 deletion (-a/aa), there would be a 50% chance that this couple's current and any future pregnancies would be a carrier for alpha thalassemia like FOB Yorim (-a/-a) and a 50% chance to be a silent carrier for alpha thalassemia like Donelle (-a/aa).   We discussed that carriers (-a/-a) may have mild anemia but are not affected with alpha thalassemia.  Hemoglobin E: FOB was found to have hemoglobin E disease as he is positive for two copies of the pathogenic variant c.79G>A (p.E27K) in his HBB genes. Therefore, his hemoglobin is present as Hb E/E. This couple's current and future pregnancies are expected to be carriers for hemoglobin E disease (Hb A/E), since Adda was not found to be a carrier (Hb A/A).  People with hemoglobin E disease usually do not have symptoms other than sometimes having very mild anemia and more rarely, having a slightly enlarged spleen. People with hemoglobin E disease rarely need treatment.  Spinal muscular atrophy: FOB was found to be a carrier for spinal muscular atrophy, as he carries only one copy of the SMN1 gene. Since [facility] was not found to be a carrier for SMA, the chance this couple's current and future pregnancies would be affected is very low.  We discussed that Newborn Screening will screen for beta-hemoglobinopathies, including HbE disease, and spinal muscular atrophy. It will not screen for alpha thalassemia. Moreover, we recommend that Yorim  inform his PCP of his alpha thalassemia and hemoglobin E results for further workup and referral to appropriate specialists if indicated.  25 minutes were spent on the date of the encounter in service to the patient including preparation, telephone consultation, discussion of test reports and available next steps, genetic risk assessment, documentation, and care coordination.   Sheppard Plumber, MS, [facility] Certified Genetic Counselor [facility] for Maternal Fetal [facility] [phone]

## 2023-05-13 ENCOUNTER — Encounter: Admitting: Obstetrics and Gynecology

## 2023-05-25 ENCOUNTER — Ambulatory Visit

## 2023-05-25 ENCOUNTER — Other Ambulatory Visit: Payer: Self-pay | Admitting: *Deleted

## 2023-05-25 ENCOUNTER — Ambulatory Visit: Attending: Obstetrics and Gynecology

## 2023-05-25 ENCOUNTER — Ambulatory Visit (HOSPITAL_BASED_OUTPATIENT_CLINIC_OR_DEPARTMENT_OTHER): Admitting: Obstetrics and Gynecology

## 2023-05-25 VITALS — BP 117/64 | HR 88

## 2023-05-25 DIAGNOSIS — O24410 Gestational diabetes mellitus in pregnancy, diet controlled: Secondary | ICD-10-CM

## 2023-05-25 DIAGNOSIS — D563 Thalassemia minor: Secondary | ICD-10-CM | POA: Diagnosis not present

## 2023-05-25 DIAGNOSIS — O99012 Anemia complicating pregnancy, second trimester: Secondary | ICD-10-CM | POA: Diagnosis not present

## 2023-05-25 DIAGNOSIS — Z3A24 24 weeks gestation of pregnancy: Secondary | ICD-10-CM

## 2023-05-25 NOTE — Progress Notes (Signed)
 Maternal-Fetal Medicine Consultation Name: Julia Bryan, Julia Bryan MRN: [id number]  G3 P1011 at 24w gestation. Gestational diabetes.  Reportedly well-controlled on diet.  Patient reports her fasting levels are between 80 and 95 mg/dL, and postprandial levels are below 122 mg/dL. She is a silent carrier for alpha thalassemia and her partner is a carrier for alpha thalassemia.  Patient was counseled by our genetic counselor (see separate note).  On today's ultrasound, the estimated fetal weight is at the 93rd percentile and the abdominal circumference measurement is at the 92nd percentile.  Amniotic fluid is normal good fetal activity seen.  I reassured the patient of the findings.  I explained the limitations of ultrasound and accurately estimating fetal weights.  I discussed the importance of good blood glucose control to prevent adverse outcomes including fetal macrosomia.  Recommendations - An appointment was made for her to return in 4 weeks for fetal growth assessment.  Consultation including face-to-face (more than 50%) counseling 10 minutes.

## 2023-05-27 ENCOUNTER — Other Ambulatory Visit: Payer: Self-pay

## 2023-05-27 ENCOUNTER — Encounter: Payer: Self-pay | Admitting: Obstetrics & Gynecology

## 2023-05-27 ENCOUNTER — Ambulatory Visit: Admitting: Obstetrics & Gynecology

## 2023-05-27 VITALS — BP 103/68 | HR 99 | Wt 161.0 lb

## 2023-05-27 DIAGNOSIS — O24410 Gestational diabetes mellitus in pregnancy, diet controlled: Secondary | ICD-10-CM

## 2023-05-27 DIAGNOSIS — Z3A24 24 weeks gestation of pregnancy: Secondary | ICD-10-CM | POA: Diagnosis not present

## 2023-05-27 DIAGNOSIS — Z3492 Encounter for supervision of normal pregnancy, unspecified, second trimester: Secondary | ICD-10-CM

## 2023-05-27 NOTE — Progress Notes (Signed)
   PRENATAL VISIT NOTE  Subjective:  Julia Bryan is a 23 y.o. G3P1011 at [redacted]w[redacted]d being seen today for ongoing prenatal care.  She is currently monitored for the following issues for this high-risk pregnancy and has Supervision of high risk pregnancy; Gestational diabetes; Alpha thalassemia silent carrier; and Macrosomia on their problem list.  Patient reports occasional contractions.  Contractions: Not present. Vag. Bleeding: None.  Movement: Present. Denies leaking of fluid.   The following portions of the patient's history were reviewed and updated as appropriate: allergies, current medications, past family history, past medical history, past social history, past surgical history and problem list.   Objective:   Vitals:   05/27/23 1705  BP: 103/68  Pulse: 99  Weight: 161 lb (73 kg)    Fetal Status: Fetal Heart Rate (bpm): 156   Movement: Present     General:  Alert, oriented and cooperative. Patient is in no acute distress.  Skin: Skin is warm and dry. No rash noted.   Cardiovascular: Normal heart rate noted  Respiratory: Normal respiratory effort, no problems with respiration noted  Abdomen: Soft, gravid, appropriate for gestational age.  Pain/Pressure: Absent     Pelvic: Cervical exam deferred        Extremities: Normal range of motion.  Edema: None  Mental Status: Normal mood and affect. Normal behavior. Normal judgment and thought content.   Assessment and Plan:  Pregnancy: G3P1011 at [redacted]w[redacted]d 1. [redacted] weeks gestation of pregnancy (Primary)   2. Diet controlled gestational diabetes mellitus (GDM) in second trimester All FBS <95 and PP <122  3. Encounter for supervision of low-risk pregnancy in second trimester Following for GDM and LGA  Preterm labor symptoms and general obstetric precautions including but not limited to vaginal bleeding, contractions, leaking of fluid and fetal movement were reviewed in detail with the patient. Please refer to After Visit Summary for other  counseling recommendations.   Return in about 4 weeks (around 06/24/2023).  Future Appointments  Date Time Provider Department Center  06/22/2023 11:00 AM [facility] PROVIDER 1 [facility] 2 Center  06/22/2023 11:30 AM [facility] US3 [facility]  06/23/2023 11:15 AM Tari Fare, CNM [facility]  07/20/2023 11:00 AM [facility] PROVIDER 1 [facility]  07/20/2023 11:30 AM [facility] US2 [facility]    Onnie Bilis, MD

## 2023-06-22 ENCOUNTER — Ambulatory Visit: Attending: Obstetrics and Gynecology | Admitting: Obstetrics

## 2023-06-22 ENCOUNTER — Ambulatory Visit

## 2023-06-22 VITALS — BP 106/59 | HR 83

## 2023-06-22 DIAGNOSIS — Z148 Genetic carrier of other disease: Secondary | ICD-10-CM | POA: Insufficient documentation

## 2023-06-22 DIAGNOSIS — Z363 Encounter for antenatal screening for malformations: Secondary | ICD-10-CM | POA: Insufficient documentation

## 2023-06-22 DIAGNOSIS — O24410 Gestational diabetes mellitus in pregnancy, diet controlled: Secondary | ICD-10-CM

## 2023-06-22 DIAGNOSIS — Z3A28 28 weeks gestation of pregnancy: Secondary | ICD-10-CM

## 2023-06-22 DIAGNOSIS — O3663X0 Maternal care for excessive fetal growth, third trimester, not applicable or unspecified: Secondary | ICD-10-CM

## 2023-06-22 DIAGNOSIS — Z3492 Encounter for supervision of normal pregnancy, unspecified, second trimester: Secondary | ICD-10-CM

## 2023-06-22 DIAGNOSIS — Z3688 Encounter for antenatal screening for fetal macrosomia: Secondary | ICD-10-CM | POA: Insufficient documentation

## 2023-06-22 DIAGNOSIS — O99013 Anemia complicating pregnancy, third trimester: Secondary | ICD-10-CM | POA: Diagnosis not present

## 2023-06-22 DIAGNOSIS — O3660X0 Maternal care for excessive fetal growth, unspecified trimester, not applicable or unspecified: Secondary | ICD-10-CM

## 2023-06-22 DIAGNOSIS — D563 Thalassemia minor: Secondary | ICD-10-CM

## 2023-06-22 NOTE — Progress Notes (Signed)
 MFM Consult Note  Julia Bryan is currently at 28 weeks and 1 day.  She has been followed due to a large for gestational age fetus noted on her prior ultrasound exams and diet-controlled gestational diabetes.    I reviewed her fingerstick logs from the past 2 weeks.  The majority of her fingerstick values are within the normal range.  On today's exam, the overall EFW of 3 pounds 6 ounces measures at the 97th percentile for her gestational age.    There was normal amniotic fluid noted with a total AFI of 18.31 cm.  The following were discussed during our consultation today:  Gestational diabetes and pregnancy  The implications and management of diabetes in pregnancy was discussed in detail with the patient.    She was advised to continue to monitor her fingersticks 4 times daily (fasting and 2 hours after each meal).    She was advised that our goals for her fingerstick values are fasting values of 90-95 or less and two-hour postprandial values of 120 or less.    Should the majority (greater than 50%) of her fingerstick results be above these values, she may have to be started on insulin or metformin to help her achieve better glycemic control.   Due to gestational diabetes and the large for gestational age fetus, we will continue to follow her with monthly growth ultrasounds.    Weekly fetal testing should be started at 32 weeks should she require insulin or metformin for treatment.  Showed a large for gestational age fetus continue to be noted later in her pregnancy along with gestational diabetes, delivery should probably occur at between 73 to 38 weeks.    A follow-up growth scan was scheduled in 4 weeks.    The patient stated that all of her questions were answered today.  A total of 20 minutes was spent counseling and coordinating the care for this patient.  Greater than 50% of the time was spent in direct face-to-face contact.

## 2023-06-23 ENCOUNTER — Other Ambulatory Visit: Payer: Self-pay

## 2023-06-23 ENCOUNTER — Ambulatory Visit: Admitting: Certified Nurse Midwife

## 2023-06-23 VITALS — BP 103/67 | HR 84 | Wt 164.1 lb

## 2023-06-23 DIAGNOSIS — O24410 Gestational diabetes mellitus in pregnancy, diet controlled: Secondary | ICD-10-CM | POA: Diagnosis not present

## 2023-06-23 DIAGNOSIS — Z3A28 28 weeks gestation of pregnancy: Secondary | ICD-10-CM | POA: Diagnosis not present

## 2023-06-23 DIAGNOSIS — O0993 Supervision of high risk pregnancy, unspecified, third trimester: Secondary | ICD-10-CM

## 2023-06-23 NOTE — Progress Notes (Signed)
   PRENATAL VISIT NOTE  Subjective:  Julia Bryan is a 23 y.o. G3P1011 at [redacted]w[redacted]d being seen today for ongoing prenatal care.  She is currently monitored for the following issues for this high-risk pregnancy and has Supervision of high risk pregnancy; Gestational diabetes; Alpha thalassemia silent carrier; and Macrosomia on their problem list.  Patient reports no complaints.   . Vag. Bleeding: None.  Movement: Present. Denies leaking of fluid.   The following portions of the patient's history were reviewed and updated as appropriate: allergies, current medications, past family history, past medical history, past social history, past surgical history and problem list.   Objective:    Vitals:   06/23/23 1124  BP: 103/67  Pulse: 84  Weight: 164 lb 1.6 oz (74.4 kg)    Fetal Status:  Fetal Heart Rate (bpm): 164 Fundal Height: 28 cm Movement: Present    General: Alert, oriented and cooperative. Patient is in no acute distress.  Skin: Skin is warm and dry. No rash noted.   Cardiovascular: Normal heart rate noted  Respiratory: Normal respiratory effort, no problems with respiration noted  Abdomen: Soft, gravid, appropriate for gestational age.  Pain/Pressure: Absent     Pelvic: Cervical exam deferred        Extremities: Normal range of motion.  Edema: None  Mental Status: Normal mood and affect. Normal behavior. Normal judgment and thought content.   Assessment and Plan:  Pregnancy: G3P1011 at [redacted]w[redacted]d 1. Supervision of high risk pregnancy in third trimester (Primary) - Patient doing well.  - Reports frequent and vigorous fetal movement.   2. [redacted] weeks gestation of pregnancy - Fundal height appropriate for gestational age at this time.  - Growth US  yesterday, Est. FW: 1539 gm 3 lb 6 oz 97 %   3. Diet controlled gestational diabetes mellitus (GDM) in second trimester - Majority of values within range. Patient admits that she eats outside of recommendations for holidays and birthdays.  -  Encouraged to make smart dietary decisions,   Preterm labor symptoms and general obstetric precautions including but not limited to vaginal bleeding, contractions, leaking of fluid and fetal movement were reviewed in detail with the patient. Please refer to After Visit Summary for other counseling recommendations.   Return in about 2 weeks (around 07/07/2023) for HROB.  Future Appointments  Date Time Provider Department Center  07/07/2023  1:15 PM Curlie Doughty [facility]  07/20/2023 11:00 AM [facility] PROVIDER 1 [facility]  07/20/2023 11:30 AM [facility] US2 [facility]    Julia Cavan Maurie Southern) Marlys Singh, MSN, CNM  Center for [facility]  06/23/2023 1:24 PM

## 2023-07-04 NOTE — Progress Notes (Unsigned)
   PRENATAL VISIT NOTE  Subjective:  Julia Bryan is a 23 y.o. G3P1011 at [redacted]w[redacted]d being seen today for ongoing prenatal care.  She is currently monitored for the following issues for this {Blank single:19197::"high-risk","low-risk"} pregnancy and has Supervision of high risk pregnancy; Gestational diabetes; Alpha thalassemia silent carrier; and Macrosomia on their problem list.  Patient reports {sx:14538}.   .  .   . Denies leaking of fluid.   The following portions of the patient's history were reviewed and updated as appropriate: allergies, current medications, past family history, past medical history, past social history, past surgical history and problem list.   Objective:    There were no vitals filed for this visit.  Fetal Status:           General: Alert, oriented and cooperative. Patient is in no acute distress.  Skin: Skin is warm and dry. No rash noted.   Cardiovascular: Normal heart rate noted  Respiratory: Normal respiratory effort, no problems with respiration noted  Abdomen: Soft, gravid, appropriate for gestational age.        Pelvic: {Blank single:19197::"Cervical exam performed in the presence of a chaperone","Cervical exam deferred"}        Extremities: Normal range of motion.     Mental Status: Normal mood and affect. Normal behavior. Normal judgment and thought content.   Assessment and Plan:  Pregnancy: G3P1011 at 100w6d 1. Supervision of high risk pregnancy in third trimester (Primary) ***  2. [redacted] weeks gestation of pregnancy ***  3. Diet controlled gestational diabetes mellitus (GDM) in second trimester ***  4. Alpha thalassemia silent carrier ***  {Blank single:19197::"Term","Preterm"} labor symptoms and general obstetric precautions including but not limited to vaginal bleeding, contractions, leaking of fluid and fetal movement were reviewed in detail with the patient. Please refer to After Visit Summary for other counseling recommendations.   Return in  about 2 weeks (around 07/21/2023) for LOB.  Future Appointments  Date Time Provider Department Center  07/07/2023  1:15 PM Curlie Doughty [facility]  07/20/2023 11:00 AM [facility] PROVIDER 1 [facility]  07/20/2023 11:30 AM [facility] US2 [facility]    Salomon Cree, CNM

## 2023-07-07 ENCOUNTER — Other Ambulatory Visit: Payer: Self-pay

## 2023-07-07 ENCOUNTER — Ambulatory Visit (INDEPENDENT_AMBULATORY_CARE_PROVIDER_SITE_OTHER): Admitting: Certified Nurse Midwife

## 2023-07-07 VITALS — BP 101/66 | HR 106 | Wt 162.0 lb

## 2023-07-07 DIAGNOSIS — D563 Thalassemia minor: Secondary | ICD-10-CM

## 2023-07-07 DIAGNOSIS — O99113 Other diseases of the blood and blood-forming organs and certain disorders involving the immune mechanism complicating pregnancy, third trimester: Secondary | ICD-10-CM | POA: Diagnosis not present

## 2023-07-07 DIAGNOSIS — O24410 Gestational diabetes mellitus in pregnancy, diet controlled: Secondary | ICD-10-CM | POA: Diagnosis not present

## 2023-07-07 DIAGNOSIS — Z3A30 30 weeks gestation of pregnancy: Secondary | ICD-10-CM

## 2023-07-07 DIAGNOSIS — O0993 Supervision of high risk pregnancy, unspecified, third trimester: Secondary | ICD-10-CM

## 2023-07-20 ENCOUNTER — Ambulatory Visit

## 2023-07-20 ENCOUNTER — Other Ambulatory Visit: Payer: Self-pay | Admitting: Obstetrics and Gynecology

## 2023-07-20 ENCOUNTER — Ambulatory Visit: Attending: Obstetrics and Gynecology | Admitting: Maternal & Fetal Medicine

## 2023-07-20 ENCOUNTER — Other Ambulatory Visit: Payer: Self-pay | Admitting: *Deleted

## 2023-07-20 VITALS — BP 117/63 | HR 93

## 2023-07-20 DIAGNOSIS — Z3688 Encounter for antenatal screening for fetal macrosomia: Secondary | ICD-10-CM | POA: Insufficient documentation

## 2023-07-20 DIAGNOSIS — O99013 Anemia complicating pregnancy, third trimester: Secondary | ICD-10-CM

## 2023-07-20 DIAGNOSIS — O3663X0 Maternal care for excessive fetal growth, third trimester, not applicable or unspecified: Secondary | ICD-10-CM | POA: Diagnosis not present

## 2023-07-20 DIAGNOSIS — D563 Thalassemia minor: Secondary | ICD-10-CM

## 2023-07-20 DIAGNOSIS — Z148 Genetic carrier of other disease: Secondary | ICD-10-CM | POA: Diagnosis not present

## 2023-07-20 DIAGNOSIS — Z3A32 32 weeks gestation of pregnancy: Secondary | ICD-10-CM | POA: Diagnosis not present

## 2023-07-20 DIAGNOSIS — Z3493 Encounter for supervision of normal pregnancy, unspecified, third trimester: Secondary | ICD-10-CM

## 2023-07-20 DIAGNOSIS — O24410 Gestational diabetes mellitus in pregnancy, diet controlled: Secondary | ICD-10-CM

## 2023-07-20 DIAGNOSIS — Z363 Encounter for antenatal screening for malformations: Secondary | ICD-10-CM | POA: Diagnosis not present

## 2023-07-20 NOTE — Progress Notes (Signed)
   Patient information  Patient Name: Julia Bryan  Patient MRN:   [id number]  Referring practice: MFM Referring Provider: [facility])  Problem List   Patient Active Problem List   Diagnosis Date Noted   Macrosomia 04/20/2023   Alpha thalassemia silent carrier 03/04/2023   Gestational diabetes 02/19/2023   Supervision of high risk pregnancy 02/11/2023   Maternal Fetal medicine Consult  Julia Bryan is a 23 y.o. G3P1011 at [redacted]w[redacted]d here for ultrasound and consultation. Julia Bryan is doing well today with no acute concerns. Today we focused on the following:   GDMA1: The patient reports normal fasting blood sugars but her postprandial blood sugars are elevated about half of the time.  I discussed the estimated fetal weight is in the 99th percentile but the amniotic fluid is normal.  This suggest that her glucose is suboptimally controlled.  For this reason I encouraged her to use dietary discretion and to bring her glucose log back to her OB provider in 1 week to see if she needs to start medication.  Depending on her glucose control we will continue weekly antenatal testing and alter delivery timing as necessary.   The patient had time to ask questions that were answered to her satisfaction.  She verbalized understanding and agrees to proceed with the plan below.  Sonographic findings Single intrauterine pregnancy. Fetal cardiac activity: Observed. Presentation: Cephalic. Interval fetal anatomy appears normal. Fetal biometry shows the estimated fetal weight at the 97 percentile. Amniotic fluid: Within normal limits.  MVP: 8.64 cm. Placenta: Posterior. BPP: 8/8.   There are limitations of prenatal ultrasound such as the inability to detect certain abnormalities due to poor visualization. Various factors such as fetal position, gestational age and maternal body habitus may increase the difficulty in visualizing the fetal anatomy.    Recommendations -Weekly  antenatal testing until at least she has proper glycemic control either with diet or medication.  If medication is required weekly testing should continue with delivery around 38 weeks if the fetus continues to be large for gestational age  Review of Systems: A review of systems was performed and was negative except per HPI   Vitals and Physical Exam    07/20/2023   11:20 AM 07/07/2023    1:34 PM 06/23/2023   11:24 AM  Vitals with BMI  Weight  162 lbs 164 lbs 2 oz  Systolic 117 101 440  Diastolic 63 66 67  Pulse 93 106 84    Sitting comfortably on the sonogram table Nonlabored breathing Normal rate and rhythm Abdomen is nontender  Past pregnancies OB History  Gravida Para Term Preterm AB Living  3 1 1  0 1 1  SAB IAB Ectopic Multiple Live Births  1 0 0 0 1    # Outcome Date GA Lbr Len/2nd Weight Sex Type Anes PTL Lv  3 Current           2 Term 11/27/19 [redacted]w[redacted]d 10:07 / 00:40 8 lb 0.2 oz (3.635 kg) F Vag-Spont EPI  LIV  1 SAB 09/2018            I spent 30 minutes reviewing the patients chart, including labs and images as well as counseling the patient about her medical conditions. Greater than 50% of the time was spent in direct face-to-face patient counseling.  Penney Bowling  MFM, [facility]   07/20/2023  11:43 AM

## 2023-07-27 ENCOUNTER — Ambulatory Visit: Attending: Maternal & Fetal Medicine

## 2023-07-27 ENCOUNTER — Ambulatory Visit

## 2023-07-27 VITALS — BP 111/59

## 2023-07-27 DIAGNOSIS — Z3A34 34 weeks gestation of pregnancy: Secondary | ICD-10-CM

## 2023-07-27 DIAGNOSIS — O24410 Gestational diabetes mellitus in pregnancy, diet controlled: Secondary | ICD-10-CM | POA: Diagnosis present

## 2023-07-27 DIAGNOSIS — Z3A33 33 weeks gestation of pregnancy: Secondary | ICD-10-CM | POA: Insufficient documentation

## 2023-07-27 DIAGNOSIS — Z3493 Encounter for supervision of normal pregnancy, unspecified, third trimester: Secondary | ICD-10-CM

## 2023-07-27 NOTE — Procedures (Signed)
 Julia Bryan [birth year] [redacted]w[redacted]d  Fetus A Non-Stress Test Interpretation for 07/27/23  NST only  Indication: Diabetes  Fetal Heart Rate A Mode: External Baseline Rate (A): 140 bpm Variability: Moderate Accelerations: 15 x 15 Decelerations: None Multiple birth?: No  Uterine Activity Mode: Palpation, Toco Contraction Frequency (min): none Resting Tone Palpated: Relaxed  Interpretation (Fetal Testing) Nonstress Test Interpretation: Reactive Overall Impression: Reassuring for gestational age Comments: Dr. William reviewed tracing

## 2023-07-28 ENCOUNTER — Other Ambulatory Visit: Payer: Self-pay

## 2023-07-28 ENCOUNTER — Ambulatory Visit (INDEPENDENT_AMBULATORY_CARE_PROVIDER_SITE_OTHER): Admitting: Obstetrics and Gynecology

## 2023-07-28 VITALS — BP 108/65 | HR 94 | Wt 165.5 lb

## 2023-07-28 DIAGNOSIS — Z3A33 33 weeks gestation of pregnancy: Secondary | ICD-10-CM | POA: Diagnosis not present

## 2023-07-28 DIAGNOSIS — O0993 Supervision of high risk pregnancy, unspecified, third trimester: Secondary | ICD-10-CM

## 2023-07-28 DIAGNOSIS — O24410 Gestational diabetes mellitus in pregnancy, diet controlled: Secondary | ICD-10-CM | POA: Diagnosis not present

## 2023-07-28 DIAGNOSIS — D563 Thalassemia minor: Secondary | ICD-10-CM | POA: Diagnosis not present

## 2023-07-28 DIAGNOSIS — O3663X0 Maternal care for excessive fetal growth, third trimester, not applicable or unspecified: Secondary | ICD-10-CM

## 2023-07-28 NOTE — Patient Instructions (Signed)
 Things to Try After 37 weeks to Encourage Labor/Get Ready for Labor:    Try the [employer] at [email or web address].com/.com daily to improve baby's position and encourage the onset of labor.  Walk a little and rest a little every day.  Change positions often.  Cervical Ripening: May try one or both Red Raspberry Leaf capsules or tea:  two 300mg  or 400mg  tablets with each meal, 2-3 times a day, or 1-3 cups of tea daily  Potential Side Effects Of Raspberry Leaf:  Most women do not experience any side effects from drinking raspberry leaf tea. However, nausea and loose stools are possible   Evening Primrose Oil capsules: take 1 capsule by mouth and place one capsule in the vagina every night.    Some of the potential side effects:  Upset stomach  Loose stools or diarrhea  Headaches  Nausea  Sex can also help the cervix ripen and encourage labor onset.

## 2023-07-28 NOTE — Progress Notes (Signed)
   PRENATAL VISIT NOTE  Subjective:  Julia Bryan is a 23 y.o. G3P1011 at [redacted]w[redacted]d being seen today for ongoing prenatal care.  She is currently monitored for the following issues for this high-risk pregnancy and has Supervision of high risk pregnancy; Gestational diabetes; Alpha thalassemia silent carrier; and Macrosomia on their problem list.  Patient reports pelvic pressure.  Contractions: Irritability. Vag. Bleeding: None.  Movement: Present. Denies leaking of fluid.   The following portions of the patient's history were reviewed and updated as appropriate: allergies, current medications, past family history, past medical history, past social history, past surgical history and problem list.   Objective:    Vitals:   07/28/23 1416  BP: 108/65  Pulse: 94  Weight: 165 lb 8 oz (75.1 kg)    Fetal Status:  Fetal Heart Rate (bpm): 140   Movement: Present    General: Alert, oriented and cooperative. Patient is in no acute distress.  Skin: Skin is warm and dry. No rash noted.   Cardiovascular: Normal heart rate noted  Respiratory: Normal respiratory effort, no problems with respiration noted  Abdomen: Soft, gravid, appropriate for gestational age.  Pain/Pressure: Present     Pelvic: Cervical exam deferred        Extremities: Normal range of motion.  Edema: None  Mental Status: Normal mood and affect. Normal behavior. Normal judgment and thought content.   Assessment and Plan:  Pregnancy: G3P1011 at [redacted]w[redacted]d 1. Supervision of high risk pregnancy in third trimester (Primary) BP and FHR normal Doing well, feeling regular movement    2. [redacted] weeks gestation of pregnancy Anticipatory guidance regarding upcoming appts  Supportive measures for pelvic pressure, maternity belt provided   3. Diet controlled gestational diabetes mellitus (GDM) in second trimester Continue weekly antenatal testing, growth u/s  Majority of fasting and pp normal, continue dietary changes   4. Alpha thalassemia  silent carrier   5. Excessive fetal growth affecting management of pregnancy in third trimester, single or unspecified fetus 6/16 efw 97%, afi normal bpp 8/8   Preterm labor symptoms and general obstetric precautions including but not limited to vaginal bleeding, contractions, leaking of fluid and fetal movement were reviewed in detail with the patient. Please refer to After Visit Summary for other counseling recommendations.   Return in two week for routine prenatal   Future Appointments  Date Time Provider Department Center  08/03/2023  2:00 PM [facility] PROVIDER 1 [facility]  08/03/2023  2:15 PM [facility] NST [facility]  08/10/2023  1:00 PM [facility] PROVIDER 1 [facility]  08/10/2023  1:15 PM [facility] NST [facility]  08/11/2023  1:15 PM Eldonna Suzen Octave, MD [facility]  08/18/2023  1:15 PM Nicholaus Burnard HERO, MD [facility]  08/18/2023  2:30 PM [facility] PROVIDER 1 [facility]  08/18/2023  3:00 PM [facility] US4 [facility]    Nidia Daring, FNP

## 2023-08-03 ENCOUNTER — Ambulatory Visit: Attending: Maternal & Fetal Medicine | Admitting: *Deleted

## 2023-08-03 ENCOUNTER — Ambulatory Visit

## 2023-08-03 VITALS — BP 106/57

## 2023-08-03 DIAGNOSIS — O24410 Gestational diabetes mellitus in pregnancy, diet controlled: Secondary | ICD-10-CM | POA: Insufficient documentation

## 2023-08-03 DIAGNOSIS — Z3A34 34 weeks gestation of pregnancy: Secondary | ICD-10-CM | POA: Diagnosis not present

## 2023-08-03 NOTE — Procedures (Signed)
 Julia Bryan [birth year] [redacted]w[redacted]d  Fetus A Non-Stress Test Interpretation for 08/03/23  Indication: Diabetes  Fetal Heart Rate A Mode: External Baseline Rate (A): 140 bpm Variability: Moderate Accelerations: 15 x 15 Decelerations: None Multiple birth?: No  Uterine Activity Mode: Palpation, Toco Contraction Frequency (min): none Resting Tone Palpated: Relaxed  Interpretation (Fetal Testing) Nonstress Test Interpretation: Reactive Overall Impression: Reassuring for gestational age Comments: Dr. Arna reviewed tracing

## 2023-08-10 ENCOUNTER — Ambulatory Visit: Attending: Maternal & Fetal Medicine | Admitting: *Deleted

## 2023-08-10 ENCOUNTER — Ambulatory Visit (HOSPITAL_BASED_OUTPATIENT_CLINIC_OR_DEPARTMENT_OTHER): Admitting: Maternal & Fetal Medicine

## 2023-08-10 VITALS — BP 120/60 | HR 90

## 2023-08-10 DIAGNOSIS — Z3A35 35 weeks gestation of pregnancy: Secondary | ICD-10-CM | POA: Diagnosis not present

## 2023-08-10 DIAGNOSIS — O24410 Gestational diabetes mellitus in pregnancy, diet controlled: Secondary | ICD-10-CM | POA: Diagnosis not present

## 2023-08-10 NOTE — Procedures (Signed)
 Julia Bryan 2000/12/21 [redacted]w[redacted]d  Fetus A Non-Stress Test Interpretation for 08/10/23  Indication: Diabetes  Fetal Heart Rate A Mode: External Baseline Rate (A): 150 bpm Variability: Moderate Accelerations: 15 x 15 Decelerations: None Multiple birth?: No  Uterine Activity Mode: Palpation, Toco Contraction Frequency (min): none Resting Tone Palpated: Relaxed  Interpretation (Fetal Testing) Nonstress Test Interpretation: Reactive Overall Impression: Reassuring for gestational age Comments: Dr. kizzie reviewed tracing

## 2023-08-11 ENCOUNTER — Ambulatory Visit: Admitting: Family Medicine

## 2023-08-11 ENCOUNTER — Other Ambulatory Visit: Payer: Self-pay

## 2023-08-11 ENCOUNTER — Encounter: Payer: Self-pay | Admitting: Family Medicine

## 2023-08-11 VITALS — BP 97/64 | HR 93 | Wt 164.1 lb

## 2023-08-11 DIAGNOSIS — O24410 Gestational diabetes mellitus in pregnancy, diet controlled: Secondary | ICD-10-CM

## 2023-08-11 DIAGNOSIS — O3663X0 Maternal care for excessive fetal growth, third trimester, not applicable or unspecified: Secondary | ICD-10-CM

## 2023-08-11 DIAGNOSIS — Z23 Encounter for immunization: Secondary | ICD-10-CM

## 2023-08-11 DIAGNOSIS — Z3A35 35 weeks gestation of pregnancy: Secondary | ICD-10-CM | POA: Diagnosis not present

## 2023-08-11 DIAGNOSIS — Z3493 Encounter for supervision of normal pregnancy, unspecified, third trimester: Secondary | ICD-10-CM

## 2023-08-11 NOTE — Progress Notes (Signed)
   PRENATAL VISIT NOTE  Subjective:  Jewell Bollen is a 23 y.o. G3P1011 at [redacted]w[redacted]d being seen today for ongoing prenatal care.  She is currently monitored for the following issues for this high-risk pregnancy and has Supervision of high risk pregnancy; Gestational diabetes; Alpha thalassemia silent carrier; and Macrosomia on their problem list.  Patient reports no complaints.  Contractions: Irritability. Vag. Bleeding: None.  Movement: Present. Denies leaking of fluid.   The following portions of the patient's history were reviewed and updated as appropriate: allergies, current medications, past family history, past medical history, past social history, past surgical history and problem list.   Objective:    Vitals:   08/11/23 1330  BP: 97/64  Pulse: 93  Weight: 164 lb 1.6 oz (74.4 kg)    Fetal Status:  Fetal Heart Rate (bpm): 153 Fundal Height: 37 cm Movement: Present Presentation: Vertex  General: Alert, oriented and cooperative. Patient is in no acute distress.  Skin: Skin is warm and dry. No rash noted.   Cardiovascular: Normal heart rate noted  Respiratory: Normal respiratory effort, no problems with respiration noted  Abdomen: Soft, gravid, appropriate for gestational age.  Pain/Pressure: Present     Pelvic: Cervical exam deferred        Extremities: Normal range of motion.  Edema: None  Mental Status: Normal mood and affect. Normal behavior. Normal judgment and thought content.   Assessment and Plan:  Pregnancy: G3P1011 at [redacted]w[redacted]d 1. [redacted] weeks gestation of pregnancy  2. Encounter for supervision of low-risk pregnancy in third trimester (Primary) Up to date FH elevated, known macrosomia-- 37cm Feeling movement Concerns today: None Tdap given today  3. Macrosomia EFW 97th%  4. Diet controlled gestational diabetes mellitus (GDM) in third trimester Continues to check BG but does not have log Reports fastings < 95, does reports PP > 120 recently due to celebration parties  this past month. She reports they are always < 140 and will happen 1-2 times per week Reviewed possible IOL 38-39 wk depending on control   Preterm labor symptoms and general obstetric precautions including but not limited to vaginal bleeding, contractions, leaking of fluid and fetal movement were reviewed in detail with the patient. Please refer to After Visit Summary for other counseling recommendations.   Return in about 2 weeks (around 08/25/2023) for Routine prenatal care.  Future Appointments  Date Time Provider Department Center  08/18/2023  1:15 PM Nicholaus Burnard HERO, MD [facility]  08/18/2023  2:30 PM [facility] PROVIDER 1 [facility]  08/18/2023  3:00 PM [facility] US4 [facility]  08/26/2023  1:15 PM Ilean Norleen GAILS, MD [facility]  09/09/2023  1:15 PM Anyanwu, Gloris LABOR, MD [facility]    Suzen Maryan Masters, MD

## 2023-08-18 ENCOUNTER — Encounter: Payer: Self-pay | Admitting: Obstetrics and Gynecology

## 2023-08-18 ENCOUNTER — Other Ambulatory Visit: Payer: Self-pay | Admitting: Obstetrics and Gynecology

## 2023-08-18 ENCOUNTER — Ambulatory Visit: Attending: Maternal & Fetal Medicine

## 2023-08-18 ENCOUNTER — Ambulatory Visit (HOSPITAL_BASED_OUTPATIENT_CLINIC_OR_DEPARTMENT_OTHER): Admitting: Obstetrics and Gynecology

## 2023-08-18 ENCOUNTER — Other Ambulatory Visit: Payer: Self-pay

## 2023-08-18 ENCOUNTER — Other Ambulatory Visit (HOSPITAL_COMMUNITY)
Admission: RE | Admit: 2023-08-18 | Discharge: 2023-08-18 | Disposition: A | Discharge status: Home / Self Care | Source: Ambulatory Visit | Attending: Obstetrics and Gynecology | Admitting: Obstetrics and Gynecology

## 2023-08-18 ENCOUNTER — Ambulatory Visit (INDEPENDENT_AMBULATORY_CARE_PROVIDER_SITE_OTHER): Admitting: Obstetrics and Gynecology

## 2023-08-18 VITALS — BP 109/68 | HR 92 | Wt 164.0 lb

## 2023-08-18 DIAGNOSIS — O99013 Anemia complicating pregnancy, third trimester: Secondary | ICD-10-CM

## 2023-08-18 DIAGNOSIS — Z3A36 36 weeks gestation of pregnancy: Secondary | ICD-10-CM | POA: Insufficient documentation

## 2023-08-18 DIAGNOSIS — O24410 Gestational diabetes mellitus in pregnancy, diet controlled: Secondary | ICD-10-CM

## 2023-08-18 DIAGNOSIS — O3663X0 Maternal care for excessive fetal growth, third trimester, not applicable or unspecified: Secondary | ICD-10-CM | POA: Insufficient documentation

## 2023-08-18 DIAGNOSIS — Z3493 Encounter for supervision of normal pregnancy, unspecified, third trimester: Secondary | ICD-10-CM

## 2023-08-18 DIAGNOSIS — Z3688 Encounter for antenatal screening for fetal macrosomia: Secondary | ICD-10-CM | POA: Diagnosis not present

## 2023-08-18 DIAGNOSIS — Z113 Encounter for screening for infections with a predominantly sexual mode of transmission: Secondary | ICD-10-CM | POA: Insufficient documentation

## 2023-08-18 DIAGNOSIS — O24415 Gestational diabetes mellitus in pregnancy, controlled by oral hypoglycemic drugs: Secondary | ICD-10-CM | POA: Insufficient documentation

## 2023-08-18 DIAGNOSIS — Z148 Genetic carrier of other disease: Secondary | ICD-10-CM | POA: Diagnosis not present

## 2023-08-18 DIAGNOSIS — D563 Thalassemia minor: Secondary | ICD-10-CM | POA: Diagnosis not present

## 2023-08-18 DIAGNOSIS — Z363 Encounter for antenatal screening for malformations: Secondary | ICD-10-CM | POA: Insufficient documentation

## 2023-08-18 MED ORDER — METFORMIN HCL 500 MG PO TABS: 500.0000 mg | ORAL_TABLET | Freq: Every day | ORAL | 1 refills | Status: DC

## 2023-08-18 MED ORDER — ACCU-CHEK GUIDE TEST VI STRP: ORAL_STRIP | 12 refills | Status: DC

## 2023-08-18 NOTE — Progress Notes (Signed)
 Maternal-Fetal Medicine Consultation Name: Julia Bryan MRN: [id number]  G3 P1011 at 36w 2d gestation. Patient returned for fetal growth assessment and antenatal testing.  She has gestational diabetes.  Patient reports her fasting levels are within normal range and some postprandial levels are between 136 and 138 mg/dL.  She had a prenatal visit appointment today and was prescribed metformin  500 mg daily with breakfast.  Patient will start taking metformin  from today.  Ultrasound The estimated fetal weight is at the 92nd percentile and the abdominal circumference measurement at the 99th percentile.  Amniotic fluid is normal good fetal activity seen.  Cephalic presentation.  Antenatal testing is reassuring.  BPP 8/8.  I explained the finding and the limitations of ultrasound and accurately estimating fetal weights.  Suboptimal control of diabetes can lead to complications including stillbirth. I encouraged her to start taking metformin .  If diabetes is not well-controlled with metformin , delivery should be considered at [redacted] weeks gestation.   Recommendations - NST at your office next week for prenatal visit. - Consider delivery at [redacted] weeks gestation.  Consultation including face-to-face (more than 50%) counseling 20 minutes.

## 2023-08-18 NOTE — Progress Notes (Signed)
   PRENATAL VISIT NOTE  Subjective:  Julia Bryan is a 23 y.o. G3P1011 at [redacted]w[redacted]d being seen today for ongoing prenatal care.  She is currently monitored for the following issues for this high-risk pregnancy and has Supervision of high risk pregnancy; Gestational diabetes; Alpha thalassemia silent carrier; and Macrosomia on their problem list.  Patient reports fatigue.  Contractions: Irritability. Vag. Bleeding: None.  Movement: Present. Denies leaking of fluid.   The following portions of the patient's history were reviewed and updated as appropriate: allergies, current medications, past family history, past medical history, past social history, past surgical history and problem list.   Objective:    Vitals:   08/18/23 1327  BP: 109/68  Pulse: 92  Weight: 164 lb (74.4 kg)    Fetal Status:  Fetal Heart Rate (bpm): 155   Movement: Present    General: Alert, oriented and cooperative. Patient is in no acute distress.  Skin: Skin is warm and dry. No rash noted.   Cardiovascular: Normal heart rate noted  Respiratory: Normal respiratory effort, no problems with respiration noted  Abdomen: Soft, gravid, appropriate for gestational age.  Pain/Pressure: Present (pain and pressure located pelvic)     Pelvic: Cervical exam deferred        Extremities: Normal range of motion.  Edema: None  Mental Status: Normal mood and affect. Normal behavior. Normal judgment and thought content.   Assessment and Plan:  Pregnancy: G3P1011 at [redacted]w[redacted]d  1. Encounter for supervision of low-risk pregnancy in third trimester (Primary)  2. Diet controlled gestational diabetes mellitus (GDM) in third trimester All fasting within range 1/2 pp are elevated Will start metformin  500 mg daily F/u US  today (last growth 97%tile) Will plan for IOL at 38 weeks, pending MFM and growth today IOL for 38 weeks requested  3. [redacted] weeks gestation of pregnancy   Preterm labor symptoms and general obstetric precautions including  but not limited to vaginal bleeding, contractions, leaking of fluid and fetal movement were reviewed in detail with the patient. Please refer to After Visit Summary for other counseling recommendations.   Return in about 1 week (around 08/25/2023) for low OB.  Future Appointments  Date Time Provider Department Center  08/26/2023  1:15 PM Ilean Norleen GAILS, MD [facility]  09/09/2023  1:15 PM Anyanwu, Gloris LABOR, MD [facility]    Burnard CHRISTELLA Moats, MD

## 2023-08-19 LAB — CERVICOVAGINAL ANCILLARY ONLY
Chlamydia: NEGATIVE
Comment: NEGATIVE
Comment: NORMAL
Neisseria Gonorrhea: NEGATIVE

## 2023-08-22 LAB — CULTURE, BETA STREP (GROUP B ONLY): Strep Gp B Culture: NEGATIVE

## 2023-08-23 ENCOUNTER — Ambulatory Visit: Payer: Self-pay | Admitting: Family Medicine

## 2023-08-23 DIAGNOSIS — Z3493 Encounter for supervision of normal pregnancy, unspecified, third trimester: Secondary | ICD-10-CM

## 2023-08-25 ENCOUNTER — Encounter (HOSPITAL_COMMUNITY): Payer: Self-pay | Admitting: *Deleted

## 2023-08-25 ENCOUNTER — Telehealth (HOSPITAL_COMMUNITY): Payer: Self-pay | Admitting: *Deleted

## 2023-08-25 NOTE — Telephone Encounter (Signed)
 Preadmission screen

## 2023-08-26 ENCOUNTER — Ambulatory Visit: Admitting: Family Medicine

## 2023-08-26 ENCOUNTER — Ambulatory Visit: Attending: Obstetrics and Gynecology | Admitting: *Deleted

## 2023-08-26 ENCOUNTER — Ambulatory Visit: Admitting: *Deleted

## 2023-08-26 ENCOUNTER — Other Ambulatory Visit: Payer: Self-pay

## 2023-08-26 VITALS — BP 104/73 | HR 109 | Wt 168.0 lb

## 2023-08-26 VITALS — BP 117/59 | HR 93

## 2023-08-26 DIAGNOSIS — Z3A37 37 weeks gestation of pregnancy: Secondary | ICD-10-CM | POA: Insufficient documentation

## 2023-08-26 DIAGNOSIS — O0993 Supervision of high risk pregnancy, unspecified, third trimester: Secondary | ICD-10-CM

## 2023-08-26 DIAGNOSIS — O24419 Gestational diabetes mellitus in pregnancy, unspecified control: Secondary | ICD-10-CM

## 2023-08-26 DIAGNOSIS — O3663X0 Maternal care for excessive fetal growth, third trimester, not applicable or unspecified: Secondary | ICD-10-CM | POA: Insufficient documentation

## 2023-08-26 DIAGNOSIS — Z3493 Encounter for supervision of normal pregnancy, unspecified, third trimester: Secondary | ICD-10-CM

## 2023-08-26 DIAGNOSIS — D563 Thalassemia minor: Secondary | ICD-10-CM | POA: Diagnosis not present

## 2023-08-26 DIAGNOSIS — O24415 Gestational diabetes mellitus in pregnancy, controlled by oral hypoglycemic drugs: Secondary | ICD-10-CM | POA: Insufficient documentation

## 2023-08-26 DIAGNOSIS — O3660X0 Maternal care for excessive fetal growth, unspecified trimester, not applicable or unspecified: Secondary | ICD-10-CM

## 2023-08-26 NOTE — Procedures (Signed)
 Denielle Boulden 28-Nov-2000 [redacted]w[redacted]d  Fetus A Non-Stress Test Interpretation for 08/26/23  Indication: Gestational Diabetes medication controlled and LGA  Fetal Heart Rate A Mode: External Baseline Rate (A): 135 bpm Variability: Moderate Accelerations: 15 x 15 Decelerations: None Multiple birth?: No  Uterine Activity Mode: Toco Contraction Frequency (min): 1 U/C during NST Contraction Duration (sec): 50 Contraction Quality: Mild Resting Tone Palpated: Relaxed  Interpretation (Fetal Testing) Nonstress Test Interpretation: Reactive Comments: Tracing reviewed byDR. Arna

## 2023-08-26 NOTE — Progress Notes (Signed)
   PRENATAL VISIT NOTE  Subjective:  Julia Bryan is a 23 y.o. G3P1011 at [redacted]w[redacted]d being seen today for ongoing prenatal care.  She is currently monitored for the following issues for this high-risk pregnancy and has Supervision of high risk pregnancy; Gestational diabetes; Alpha thalassemia silent carrier; and Macrosomia on their problem list.  Patient reports no bleeding, no cramping, no leaking, and occasional contractions.  Contractions: Irritability. Vag. Bleeding: None.  Movement: Present. Denies leaking of fluid.   The following portions of the patient's history were reviewed and updated as appropriate: allergies, current medications, past family history, past medical history, past social history, past surgical history and problem list.   Objective:    Vitals:   08/26/23 1315  BP: 104/73  Pulse: (!) 109  Weight: 168 lb (76.2 kg)    Fetal Status:  Fetal Heart Rate (bpm): 150   Movement: Present    General: Alert, oriented and cooperative. Patient is in no acute distress.  Skin: Skin is warm and dry. No rash noted.   Cardiovascular: Normal heart rate noted  Respiratory: Normal respiratory effort, no problems with respiration noted  Abdomen: Soft, gravid, appropriate for gestational age.  Pain/Pressure: Present     Pelvic: Cervical exam deferred        Extremities: Normal range of motion.  Edema: None  Mental Status: Normal mood and affect. Normal behavior. Normal judgment and thought content.   Assessment and Plan:  Pregnancy: G3P1011 at [redacted]w[redacted]d 1. Encounter for supervision of high risk pregnancy in third trimester, antepartum (Primary) FHR BP appropriate today  2. Gestational diabetes mellitus (GDM) in third trimester, gestational diabetes method of control unspecified Patient with glucose log on her phone.  Most of the glucoses are within normal limits.  Occasional elevated blood sugars but better than prior to starting metformin .  Currently on metformin  500 mg once daily.   Scheduled for induction on 7/27.  Scheduled for NST at 315 today.  3. Macrosomia OB follow-up ultrasound on 7/15 showing 92nd percentile  4. Alpha thalassemia silent carrier  5. [redacted] weeks gestation of pregnancy Induction scheduled 7/27 for medication controlled gestational diabetes with macrosomia per MFM recommendation.  Term labor symptoms and general obstetric precautions including but not limited to vaginal bleeding, contractions, leaking of fluid and fetal movement were reviewed in detail with the patient. Please refer to After Visit Summary for other counseling recommendations.   No follow-ups on file.  Future Appointments  Date Time Provider Department Center  08/26/2023  3:00 PM [facility] NURSE [facility]  08/26/2023  3:15 PM [facility] NST [facility]  08/30/2023  6:45 AM [facility] SCHED ROOM [facility] None  09/09/2023  1:15 PM Anyanwu, Gloris LABOR, MD [facility]    Norleen LULLA Rover, MD

## 2023-08-29 NOTE — H&P (Signed)
 OBSTETRIC ADMISSION HISTORY AND PHYSICAL  Julia Bryan is a 23 y.o. female G3P1011 with IUP at [redacted]w[redacted]d by US  presenting for IOL  poorly controlled A2GDM c/b macrosomia despite metformin . She reports +FMs, No LOF, no VB, no blurry vision, headaches or peripheral edema, and RUQ pain.  She plans on both breast and formula feeding. She request IUD*** for birth control. She received her prenatal care at [facility]   Dating: By US  --->  Estimated Date of Delivery: 09/13/23  Sono:    @[redacted]w[redacted]d , CWD, normal anatomy, cephalic presentation, posterior placental lie, 3379g, 92% EFW, AC 99%   Prenatal History/Complications:  Patient Active Problem List   Diagnosis Date Noted   Macrosomia 04/20/2023   Alpha thalassemia silent carrier 03/04/2023   Gestational diabetes 02/19/2023   Supervision of high risk pregnancy 02/11/2023   NURSING  PROVIDER  Office Location [facility] Dating by U/S at 7.1 wks  [facility] Model Traditional Anatomy U/S    Initiated care at  [employer]  English               LAB RESULTS   Support Person FOB Genetics NIPS: LR F AFP: Negative      NT/IT (FT only)        Carrier Screen Horizon: alpha thal carrier  Rhogam  B/Positive/-- (01/15 1134) A1C/GTT Early HgbA1C: 5.7  abnormal early gtt  Flu Vaccine 02/18/23      TDaP Vaccine   Blood Type B/Positive/-- (01/15 1134)  RSV Vaccine   Antibody Negative (01/15 1134)  COVID Vaccine Not received  Rubella 1.40 (01/15 1134)  Feeding Plan Both  RPR Non Reactive (01/15 1134)  Contraception IUD HBsAg Negative (01/15 1134)  Circumcision N/a HIV Non Reactive (01/15 1134)  Pediatrician  [facility] HCVAb Non Reactive (01/15 1134)  Prenatal Classes        BTL Consent   Pap       Diagnosis  Date Value Ref Range Status  02/18/2023     Final    - Negative for intraepithelial lesion or malignancy (NILM)    BTL Pre-payment   GC/CT Initial:   36wks:    VBAC Consent   GBS Negative/-- (07/15 1643)  BRx Optimized? [ ]   yes   [ ]  no      DME Rx [ ]  BP cuff [ ]  Weight Scale Waterbirth  [ ]  Class [ ]  Consent [ ]  CNM visit  PHQ9 & GAD7 [  ] new OB [  ] 28 weeks  [  ] 36 weeks Induction  [ ]  Orders Entered [ ] Foley Y/N    Past Medical History: Past Medical History:  Diagnosis Date   Gestational diabetes    Maternal varicella, non-immune 11/28/2019   Medical history non-contributory     Past Surgical History: Past Surgical History:  Procedure Laterality Date   NO PAST SURGERIES      Obstetrical History: OB History     Gravida  3   Para  1   Term  1   Preterm  0   AB  1   Living  1      SAB  1   IAB  0   Ectopic  0   Multiple  0   Live Births  1           Social History Social History   Socioeconomic History   Marital  status: Single    Spouse name: Not on file   Number of children: Not on file   Years of education: Not on file   Highest education level: Not on file  Occupational History   Not on file  Tobacco Use   Smoking status: Never   Smokeless tobacco: Never  Vaping Use   Vaping status: Never Used  Substance and Sexual Activity   Alcohol use: Never   Drug use: Never   Sexual activity: Yes  Other Topics Concern   Not on file  Social History Narrative   Not on file   Social Drivers of Health   Financial Resource Strain: Not on file  Food Insecurity: No Food Insecurity (03/05/2023)   Hunger Vital Sign    Worried About Running Out of Food in the Last Year: Never true    Ran Out of Food in the Last Year: Never true  Transportation Needs: No Transportation Needs (02/18/2023)   PRAPARE - Administrator, Civil Service (Medical): No    Lack of Transportation (Non-Medical): No  Physical Activity: Not on file  Stress: Not on file  Social Connections: Not on file    Family History: Family History  Problem Relation Age of Onset   Diabetes Mother     Allergies: No Known Allergies  No medications prior to admission.     Review of  Systems   All systems reviewed and negative except as stated in HPI  Last menstrual period 11/29/2022, unknown if currently breastfeeding. General appearance: alert, cooperative, and appears stated age Lungs: clear to auscultation bilaterally Heart: regular rate and rhythm Abdomen: soft, non-tender; bowel sounds normal Pelvic: normal female genitalia  Extremities: Homans sign is negative, no sign of DVT Presentation: {desc; fetal presentation:14558} Fetal monitoringBaseline: *** bpm, Variability: {fhr variability:31519}, Accelerations: {fhr accel present:31520}, and Decelerations: {FHR DECEL PRESENT:31526} Uterine activity{Uterine contractions:31516}     Prenatal labs: ABO, Rh: B/Positive/-- (01/15 1134) Antibody: Negative (01/15 1134) Rubella: 1.40 (01/15 1134) RPR: Non Reactive (01/15 1134)  HBsAg: Negative (01/15 1134)  HIV: Non Reactive (01/15 1134)  GBS: Negative/-- (07/15 1643)    Lab Results  Component Value Date   GBS Negative 08/18/2023   GTT GDM on early gtt  Genetic screening  LR female, AFP neg, Alpha thal carrier on Horizon Anatomy US  macrosomia   Immunization History  Administered Date(s) Administered   Influenza, Seasonal, Injecte, Preservative Fre 02/18/2023   Tdap 08/11/2023    Prenatal Transfer Tool  Maternal Diabetes: Yes:  Diabetes Type:  Insulin/Medication controlled Genetic Screening: Normal Maternal Ultrasounds/Referrals: Other: Macrosomia Fetal Ultrasounds or other Referrals:  Referred to Materal Fetal Medicine  Maternal Substance Abuse:  No Significant Maternal Medications:  None Significant Maternal Lab Results: Group B Strep negative Number of Prenatal Visits:greater than 3 verified prenatal visits Maternal Vaccinations:TDap and Flu Other Comments:  None   No results found for this or any previous visit (from the past 24 hours).  Patient Active Problem List   Diagnosis Date Noted   Macrosomia 04/20/2023   Alpha thalassemia silent  carrier 03/04/2023   Gestational diabetes 02/19/2023   Supervision of high risk pregnancy 02/11/2023    Assessment/Plan:  Julia Bryan is a 23 y.o. G3P1011 at [redacted]w[redacted]d here for IOL poorly controlled A2GDM c/b macrosomia.   #Labor:*** #Pain: Per patient request #FWB: *** #GBS status:  negative #Feeding: Breastmilk  and Formula #Reproductive Life planning: IUD {IUD:23561}  #Macrosomia #A2GDM: on metformin  every day, @[redacted]w[redacted]d , CWD, normal anatomy, cephalic presentation, posterior placental  lie, 3379g, 92% EFW, AC 99%, proven to 3635g, estimated to be  - shoulder precautions  - CBGs Q4hrs   [name], MD  08/29/2023, 8:58 PM

## 2023-08-30 ENCOUNTER — Inpatient Hospital Stay (HOSPITAL_COMMUNITY)
Admission: AD | Admit: 2023-08-30 | Discharge: 2023-09-01 | DRG: 807 | Disposition: A | Discharge status: Home / Self Care | Attending: Family Medicine | Admitting: Family Medicine

## 2023-08-30 ENCOUNTER — Inpatient Hospital Stay (HOSPITAL_COMMUNITY)

## 2023-08-30 ENCOUNTER — Inpatient Hospital Stay (HOSPITAL_COMMUNITY): Admitting: Anesthesiology

## 2023-08-30 ENCOUNTER — Other Ambulatory Visit: Payer: Self-pay

## 2023-08-30 ENCOUNTER — Encounter (HOSPITAL_COMMUNITY): Payer: Self-pay | Admitting: Obstetrics and Gynecology

## 2023-08-30 DIAGNOSIS — Z3A37 37 weeks gestation of pregnancy: Secondary | ICD-10-CM | POA: Diagnosis not present

## 2023-08-30 DIAGNOSIS — Z148 Genetic carrier of other disease: Secondary | ICD-10-CM | POA: Diagnosis not present

## 2023-08-30 DIAGNOSIS — Z8632 Personal history of gestational diabetes: Secondary | ICD-10-CM | POA: Diagnosis present

## 2023-08-30 DIAGNOSIS — O3663X0 Maternal care for excessive fetal growth, third trimester, not applicable or unspecified: Secondary | ICD-10-CM | POA: Diagnosis present

## 2023-08-30 DIAGNOSIS — O24424 Gestational diabetes mellitus in childbirth, insulin controlled: Secondary | ICD-10-CM | POA: Diagnosis not present

## 2023-08-30 DIAGNOSIS — O24425 Gestational diabetes mellitus in childbirth, controlled by oral hypoglycemic drugs: Secondary | ICD-10-CM | POA: Diagnosis present

## 2023-08-30 DIAGNOSIS — D563 Thalassemia minor: Secondary | ICD-10-CM | POA: Diagnosis present

## 2023-08-30 DIAGNOSIS — Z833 Family history of diabetes mellitus: Secondary | ICD-10-CM

## 2023-08-30 DIAGNOSIS — Z3490 Encounter for supervision of normal pregnancy, unspecified, unspecified trimester: Secondary | ICD-10-CM

## 2023-08-30 DIAGNOSIS — O24419 Gestational diabetes mellitus in pregnancy, unspecified control: Principal | ICD-10-CM | POA: Diagnosis present

## 2023-08-30 LAB — CBC
HCT: 27 % — ABNORMAL LOW (ref 36.0–46.0)
Hemoglobin: 8.4 g/dL — ABNORMAL LOW (ref 12.0–15.0)
MCH: 24 pg — ABNORMAL LOW (ref 26.0–34.0)
MCHC: 31.1 g/dL (ref 30.0–36.0)
MCV: 77.1 fL — ABNORMAL LOW (ref 80.0–100.0)
Platelets: 313 K/uL (ref 150–400)
RBC: 3.5 MIL/uL — ABNORMAL LOW (ref 3.87–5.11)
RDW: 15.3 % (ref 11.5–15.5)
WBC: 6.5 K/uL (ref 4.0–10.5)
nRBC: 0 % (ref 0.0–0.2)

## 2023-08-30 LAB — RPR: RPR Ser Ql: NONREACTIVE

## 2023-08-30 LAB — GLUCOSE, CAPILLARY
Glucose-Capillary: 77 mg/dL (ref 70–99)
Glucose-Capillary: 69 mg/dL — ABNORMAL LOW (ref 70–99)
Glucose-Capillary: 86 mg/dL (ref 70–99)
Glucose-Capillary: 61 mg/dL — ABNORMAL LOW (ref 70–99)
Glucose-Capillary: 76 mg/dL (ref 70–99)
Glucose-Capillary: 79 mg/dL (ref 70–99)

## 2023-08-30 LAB — TYPE AND SCREEN
ABO/RH(D): B POS
Antibody Screen: NEGATIVE

## 2023-08-30 MED ORDER — LACTATED RINGERS IV SOLN
500.0000 mL | Freq: Once | INTRAVENOUS | Status: AC
  Administered 2023-08-30: 500 mL via INTRAVENOUS

## 2023-08-30 MED ORDER — PHENYLEPHRINE 80 MCG/ML (10ML) SYRINGE FOR IV PUSH (FOR BLOOD PRESSURE SUPPORT)
80.0000 ug | PREFILLED_SYRINGE | INTRAVENOUS | Status: DC | PRN
  Filled 2023-08-30: qty 10

## 2023-08-30 MED ORDER — ACETAMINOPHEN 325 MG PO TABS: 650.0000 mg | ORAL_TABLET | ORAL | Status: DC | PRN

## 2023-08-30 MED ORDER — EPHEDRINE 5 MG/ML INJ: 10.0000 mg | INTRAVENOUS | Status: DC | PRN

## 2023-08-30 MED ORDER — FENTANYL-BUPIVACAINE-NACL 0.5-0.125-0.9 MG/250ML-% EP SOLN
12.0000 mL/h | EPIDURAL | Status: DC | PRN
  Administered 2023-08-30: 12 mL/h via EPIDURAL
  Filled 2023-08-30: qty 250

## 2023-08-30 MED ORDER — SOD CITRATE-CITRIC ACID 500-334 MG/5ML PO SOLN: 30.0000 mL | ORAL | Status: DC | PRN

## 2023-08-30 MED ORDER — FENTANYL CITRATE (PF) 100 MCG/2ML IJ SOLN: 100.0000 ug | INTRAMUSCULAR | Status: DC | PRN

## 2023-08-30 MED ORDER — LACTATED RINGERS IV SOLN: 500.0000 mL | INTRAVENOUS | Status: DC | PRN

## 2023-08-30 MED ORDER — PHENYLEPHRINE 80 MCG/ML (10ML) SYRINGE FOR IV PUSH (FOR BLOOD PRESSURE SUPPORT): 80.0000 ug | PREFILLED_SYRINGE | INTRAVENOUS | Status: DC | PRN

## 2023-08-30 MED ORDER — LIDOCAINE HCL (PF) 1 % IJ SOLN
INTRAMUSCULAR | Status: DC | PRN
  Administered 2023-08-30 (×2): 4 mL via EPIDURAL

## 2023-08-30 MED ORDER — LACTATED RINGERS IV SOLN: INTRAVENOUS | Status: DC

## 2023-08-30 MED ORDER — ONDANSETRON HCL 4 MG/2ML IJ SOLN
4.0000 mg | Freq: Four times a day (QID) | INTRAMUSCULAR | Status: DC | PRN
Start: 2023-08-30 — End: 2023-08-31
  Administered 2023-08-31: 4 mg via INTRAVENOUS
  Filled 2023-08-30: qty 2

## 2023-08-30 MED ORDER — TERBUTALINE SULFATE 1 MG/ML IJ SOLN: 0.2500 mg | Freq: Once | INTRAMUSCULAR | Status: DC | PRN

## 2023-08-30 MED ORDER — OXYCODONE-ACETAMINOPHEN 5-325 MG PO TABS: 2.0000 | ORAL_TABLET | ORAL | Status: DC | PRN

## 2023-08-30 MED ORDER — OXYTOCIN-SODIUM CHLORIDE 30-0.9 UT/500ML-% IV SOLN
2.5000 [IU]/h | INTRAVENOUS | Status: DC
  Administered 2023-08-31: 2.5 [IU]/h via INTRAVENOUS
  Filled 2023-08-30: qty 500

## 2023-08-30 MED ORDER — OXYCODONE-ACETAMINOPHEN 5-325 MG PO TABS: 1.0000 | ORAL_TABLET | ORAL | Status: DC | PRN

## 2023-08-30 MED ORDER — DIPHENHYDRAMINE HCL 50 MG/ML IJ SOLN: 12.5000 mg | INTRAMUSCULAR | Status: DC | PRN

## 2023-08-30 MED ORDER — LIDOCAINE HCL (PF) 1 % IJ SOLN: 30.0000 mL | INTRAMUSCULAR | Status: DC | PRN

## 2023-08-30 MED ORDER — OXYTOCIN BOLUS FROM INFUSION
333.0000 mL | Freq: Once | INTRAVENOUS | Status: AC
  Administered 2023-08-31: 333 mL via INTRAVENOUS

## 2023-08-30 MED ORDER — OXYTOCIN-SODIUM CHLORIDE 30-0.9 UT/500ML-% IV SOLN
1.0000 m[IU]/min | INTRAVENOUS | Status: DC
  Administered 2023-08-30: 2 m[IU]/min via INTRAVENOUS
  Filled 2023-08-30: qty 500

## 2023-08-30 NOTE — Progress Notes (Signed)
 LABOR PROGRESS NOTE  Patient Name: Julia Bryan, female   DOB: 13-Apr-2000, 23 y.o.  MRN: [id number]  Patient contracting uncomfortably. Pit at 14. Amenable to recheck and AROM. Cervix 4.5/80/-2. R/B/A of AROM discussed with patient, and verbal consent obtained. Babe's head found to be well-applied. Obtained large clear fluid. Mom and babe tolerated well. Cat I. Bryan drop pit to 8 as is now ruptured.  Almarie CHRISTELLA Moats, MD

## 2023-08-30 NOTE — Anesthesia Procedure Notes (Signed)
 Epidural Patient location during procedure: OB Start time: 08/30/2023 7:43 PM End time: 08/30/2023 7:46 PM  Staffing Anesthesiologist: Paul Lamarr BRAVO, MD Performed: anesthesiologist   Preanesthetic Checklist Completed: patient identified, IV checked, risks and benefits discussed, monitors and equipment checked, pre-op evaluation and timeout performed  Epidural Patient position: sitting Prep: DuraPrep and site prepped and draped Patient monitoring: continuous pulse ox, blood pressure and heart rate Approach: midline Location: L3-L4 Injection technique: LOR air  Needle:  Needle type: Tuohy  Needle gauge: 17 G Needle length: 9 cm Needle insertion depth: 5 cm Catheter type: closed end flexible Catheter size: 19 Gauge Catheter at skin depth: 10 cm Test dose: negative and Other (1% lidocaine )  Assessment Events: blood not aspirated, no cerebrospinal fluid, injection not painful, no injection resistance, no paresthesia and negative IV test  Additional Notes Patient identified. Risks, benefits, and alternatives discussed with patient including but not limited to bleeding, infection, nerve damage, paralysis, failed block, incomplete pain control, headache, blood pressure changes, nausea, vomiting, reactions to medication, itching, and postpartum back pain. Confirmed with bedside nurse the patient's most recent platelet count. Confirmed with patient that they are not currently taking any anticoagulation, have any bleeding history, or any family history of bleeding disorders. Patient expressed understanding and wished to proceed. All questions were answered. Sterile technique was used throughout the entire procedure. Please see nursing notes for vital signs.   Crisp LOR on first pass. Test dose was given through epidural catheter and negative prior to continuing to dose epidural or start infusion. Warning signs of high block given to the patient including shortness of breath,  tingling/numbness in hands, complete motor block, or any concerning symptoms with instructions to call for help. Patient was given instructions on fall risk and not to get out of bed. All questions and concerns addressed with instructions to call with any issues or inadequate analgesia.  Reason for block:procedure for pain

## 2023-08-30 NOTE — Anesthesia Preprocedure Evaluation (Signed)
 Anesthesia Evaluation  Patient identified by MRN, date of birth, ID band Patient awake    Reviewed: Allergy & Precautions, Patient's Chart, lab work & pertinent test results  History of Anesthesia Complications Negative for: history of anesthetic complications  Airway Mallampati: II  TM Distance: >3 FB Neck ROM: Full    Dental no notable dental hx.    Pulmonary neg pulmonary ROS   Pulmonary exam normal        Cardiovascular negative cardio ROS Normal cardiovascular exam     Neuro/Psych negative neurological ROS     GI/Hepatic negative GI ROS, Neg liver ROS,,,  Endo/Other  diabetes, Gestational, Oral Hypoglycemic Agents    Renal/GU negative Renal ROS  negative genitourinary   Musculoskeletal negative musculoskeletal ROS (+)    Abdominal   Peds  Hematology negative hematology ROS (+)   Anesthesia Other Findings Day of surgery medications reviewed with patient.  Reproductive/Obstetrics (+) Pregnancy                              Anesthesia Physical Anesthesia Plan  ASA: 2  Anesthesia Plan: Epidural   Post-op Pain Management:    Induction:   PONV Risk Score and Plan: Treatment may vary due to age or medical condition  Airway Management Planned: Natural Airway  Additional Equipment: Fetal Monitoring  Intra-op Plan:   Post-operative Plan:   Informed Consent: I have reviewed the patients History and Physical, chart, labs and discussed the procedure including the risks, benefits and alternatives for the proposed anesthesia with the patient or authorized representative who has indicated his/her understanding and acceptance.       Plan Discussed with:   Anesthesia Plan Comments:         Anesthesia Quick Evaluation

## 2023-08-30 NOTE — Progress Notes (Signed)
 LABOR PROGRESS NOTE  Patient Name: Julia Bryan, female   DOB: November 21, 2000, 23 y.o.  MRN: [id number]  Cat 2 strip given subtle, intermittent variables, but remains moderate variability, CTX q6mins, pt was feeling them painfully and thusly now with epidural.  Continue pitocin  titration as tolerated.   [name], MD

## 2023-08-30 NOTE — Plan of Care (Signed)
 Problem: Education: Goal: Knowledge of General Education information will improve Description: Including pain rating scale, medication(s)/side effects and non-pharmacologic comfort measures 08/30/2023 2049 by Leigh Rumaldo BRAVO, RN Outcome: Progressing 08/30/2023 2049 by Leigh Rumaldo BRAVO, RN Outcome: Progressing   Problem: Health Behavior/Discharge Planning: Goal: Ability to manage health-related needs will improve 08/30/2023 2049 by Leigh Rumaldo BRAVO, RN Outcome: Progressing 08/30/2023 2049 by Leigh Rumaldo BRAVO, RN Outcome: Progressing   Problem: Clinical Measurements: Goal: Ability to maintain clinical measurements within normal limits will improve 08/30/2023 2049 by Leigh Rumaldo BRAVO, RN Outcome: Progressing 08/30/2023 2049 by Leigh Rumaldo BRAVO, RN Outcome: Progressing Goal: Will remain free from infection 08/30/2023 2049 by Leigh Rumaldo BRAVO, RN Outcome: Progressing 08/30/2023 2049 by Leigh Rumaldo BRAVO, RN Outcome: Progressing Goal: Diagnostic test results will improve 08/30/2023 2049 by Leigh Rumaldo BRAVO, RN Outcome: Progressing 08/30/2023 2049 by Leigh Rumaldo BRAVO, RN Outcome: Progressing Goal: Respiratory complications will improve 08/30/2023 2049 by Leigh Rumaldo BRAVO, RN Outcome: Progressing 08/30/2023 2049 by Leigh Rumaldo BRAVO, RN Outcome: Progressing Goal: Cardiovascular complication will be avoided 08/30/2023 2049 by Leigh Rumaldo BRAVO, RN Outcome: Progressing 08/30/2023 2049 by Leigh Rumaldo BRAVO, RN Outcome: Progressing   Problem: Activity: Goal: Risk for activity intolerance will decrease 08/30/2023 2049 by Leigh Rumaldo BRAVO, RN Outcome: Progressing 08/30/2023 2049 by Leigh Rumaldo BRAVO, RN Outcome: Progressing   Problem: Nutrition: Goal: Adequate nutrition will be maintained 08/30/2023 2049 by Leigh Rumaldo BRAVO, RN Outcome: Progressing 08/30/2023 2049 by Leigh Rumaldo BRAVO, RN Outcome: Progressing   Problem: Coping: Goal: Level of anxiety will decrease 08/30/2023 2049 by Leigh Rumaldo BRAVO, RN Outcome:  Progressing 08/30/2023 2049 by Leigh Rumaldo BRAVO, RN Outcome: Progressing   Problem: Elimination: Goal: Will not experience complications related to bowel motility 08/30/2023 2049 by Leigh Rumaldo BRAVO, RN Outcome: Progressing 08/30/2023 2049 by Leigh Rumaldo BRAVO, RN Outcome: Progressing Goal: Will not experience complications related to urinary retention 08/30/2023 2049 by Leigh Rumaldo BRAVO, RN Outcome: Progressing 08/30/2023 2049 by Leigh Rumaldo BRAVO, RN Outcome: Progressing   Problem: Pain Managment: Goal: General experience of comfort will improve and/or be controlled 08/30/2023 2049 by Leigh Rumaldo BRAVO, RN Outcome: Progressing 08/30/2023 2049 by Leigh Rumaldo BRAVO, RN Outcome: Progressing   Problem: Safety: Goal: Ability to remain free from injury will improve 08/30/2023 2049 by Leigh Rumaldo BRAVO, RN Outcome: Progressing 08/30/2023 2049 by Leigh Rumaldo BRAVO, RN Outcome: Progressing   Problem: Skin Integrity: Goal: Risk for impaired skin integrity will decrease 08/30/2023 2049 by Leigh Rumaldo BRAVO, RN Outcome: Progressing 08/30/2023 2049 by Leigh Rumaldo BRAVO, RN Outcome: Progressing   Problem: Education: Goal: Knowledge of Childbirth will improve 08/30/2023 2049 by Leigh Rumaldo BRAVO, RN Outcome: Progressing 08/30/2023 2049 by Leigh Rumaldo BRAVO, RN Outcome: Progressing Goal: Ability to make informed decisions regarding treatment and plan of care will improve 08/30/2023 2049 by Leigh Rumaldo BRAVO, RN Outcome: Progressing 08/30/2023 2049 by Leigh Rumaldo BRAVO, RN Outcome: Progressing Goal: Ability to state and carry out methods to decrease the pain will improve 08/30/2023 2049 by Leigh Rumaldo BRAVO, RN Outcome: Progressing 08/30/2023 2049 by Leigh Rumaldo BRAVO, RN Outcome: Progressing Goal: Individualized Educational Video(s) 08/30/2023 2049 by Leigh Rumaldo BRAVO, RN Outcome: Progressing 08/30/2023 2049 by Leigh Rumaldo BRAVO, RN Outcome: Progressing   Problem: Coping: Goal: Ability to verbalize concerns and feelings about labor and  delivery will improve 08/30/2023 2049 by Leigh Rumaldo BRAVO, RN Outcome: Progressing 08/30/2023 2049 by Leigh Rumaldo BRAVO, RN Outcome: Progressing   Problem: Life Cycle: Goal: Ability to make normal progression  through stages of labor will improve 08/30/2023 2049 by Leigh Rumaldo BRAVO, RN Outcome: Progressing 08/30/2023 2049 by Leigh Rumaldo BRAVO, RN Outcome: Progressing Goal: Ability to effectively push during vaginal delivery will improve 08/30/2023 2049 by Leigh Rumaldo BRAVO, RN Outcome: Progressing 08/30/2023 2049 by Leigh Rumaldo BRAVO, RN Outcome: Progressing   Problem: Role Relationship: Goal: Will demonstrate positive interactions with the child 08/30/2023 2049 by Leigh Rumaldo BRAVO, RN Outcome: Progressing 08/30/2023 2049 by Leigh Rumaldo BRAVO, RN Outcome: Progressing   Problem: Safety: Goal: Risk of complications during labor and delivery will decrease 08/30/2023 2049 by Leigh Rumaldo BRAVO, RN Outcome: Progressing 08/30/2023 2049 by Leigh Rumaldo BRAVO, RN Outcome: Progressing   Problem: Pain Management: Goal: Relief or control of pain from uterine contractions will improve 08/30/2023 2049 by Leigh Rumaldo BRAVO, RN Outcome: Progressing 08/30/2023 2049 by Leigh Rumaldo BRAVO, RN Outcome: Progressing   Problem: Education: Goal: Ability to describe self-care measures that may prevent or decrease complications (Diabetes Survival Skills Education) will improve 08/30/2023 2049 by Leigh Rumaldo BRAVO, RN Outcome: Progressing 08/30/2023 2049 by Leigh Rumaldo BRAVO, RN Outcome: Progressing   Problem: Coping: Goal: Ability to adjust to condition or change in health will improve 08/30/2023 2049 by Leigh Rumaldo BRAVO, RN Outcome: Progressing 08/30/2023 2049 by Leigh Rumaldo BRAVO, RN Outcome: Progressing   Problem: Fluid Volume: Goal: Ability to maintain a balanced intake and output will improve 08/30/2023 2049 by Leigh Rumaldo BRAVO, RN Outcome: Progressing 08/30/2023 2049 by Leigh Rumaldo BRAVO, RN Outcome: Progressing   Problem: Health  Behavior/Discharge Planning: Goal: Ability to identify and utilize available resources and services will improve 08/30/2023 2049 by Leigh Rumaldo BRAVO, RN Outcome: Progressing 08/30/2023 2049 by Leigh Rumaldo BRAVO, RN Outcome: Progressing Goal: Ability to manage health-related needs will improve 08/30/2023 2049 by Leigh Rumaldo BRAVO, RN Outcome: Progressing 08/30/2023 2049 by Leigh Rumaldo BRAVO, RN Outcome: Progressing   Problem: Metabolic: Goal: Ability to maintain appropriate glucose levels will improve 08/30/2023 2049 by Leigh Rumaldo BRAVO, RN Outcome: Progressing 08/30/2023 2049 by Leigh Rumaldo BRAVO, RN Outcome: Progressing   Problem: Nutritional: Goal: Maintenance of adequate nutrition will improve 08/30/2023 2049 by Leigh Rumaldo BRAVO, RN Outcome: Progressing 08/30/2023 2049 by Leigh Rumaldo BRAVO, RN Outcome: Progressing Goal: Progress toward achieving an optimal weight will improve 08/30/2023 2049 by Leigh Rumaldo BRAVO, RN Outcome: Progressing 08/30/2023 2049 by Leigh Rumaldo BRAVO, RN Outcome: Progressing   Problem: Skin Integrity: Goal: Risk for impaired skin integrity will decrease 08/30/2023 2049 by Leigh Rumaldo BRAVO, RN Outcome: Progressing 08/30/2023 2049 by Leigh Rumaldo BRAVO, RN Outcome: Progressing   Problem: Tissue Perfusion: Goal: Adequacy of tissue perfusion will improve 08/30/2023 2049 by Leigh Rumaldo BRAVO, RN Outcome: Progressing 08/30/2023 2049 by Leigh Rumaldo BRAVO, RN Outcome: Progressing

## 2023-08-31 ENCOUNTER — Encounter (HOSPITAL_COMMUNITY): Payer: Self-pay | Admitting: Family Medicine

## 2023-08-31 DIAGNOSIS — O24424 Gestational diabetes mellitus in childbirth, insulin controlled: Secondary | ICD-10-CM

## 2023-08-31 DIAGNOSIS — Z3A37 37 weeks gestation of pregnancy: Secondary | ICD-10-CM

## 2023-08-31 LAB — CBC
HCT: 28.6 % — ABNORMAL LOW (ref 36.0–46.0)
Hemoglobin: 8.9 g/dL — ABNORMAL LOW (ref 12.0–15.0)
MCH: 24.1 pg — ABNORMAL LOW (ref 26.0–34.0)
MCHC: 31.1 g/dL (ref 30.0–36.0)
MCV: 77.5 fL — ABNORMAL LOW (ref 80.0–100.0)
Platelets: 303 K/uL (ref 150–400)
RBC: 3.69 MIL/uL — ABNORMAL LOW (ref 3.87–5.11)
RDW: 15.2 % (ref 11.5–15.5)
WBC: 12.9 K/uL — ABNORMAL HIGH (ref 4.0–10.5)
nRBC: 0 % (ref 0.0–0.2)

## 2023-08-31 LAB — GLUCOSE, CAPILLARY: Glucose-Capillary: 87 mg/dL (ref 70–99)

## 2023-08-31 MED ORDER — ONDANSETRON HCL 4 MG PO TABS
4.0000 mg | ORAL_TABLET | ORAL | Status: DC | PRN
Start: 2023-08-31 — End: 2023-09-01

## 2023-08-31 MED ORDER — DIPHENHYDRAMINE HCL 25 MG PO CAPS: 25.0000 mg | ORAL_CAPSULE | Freq: Four times a day (QID) | ORAL | Status: DC | PRN

## 2023-08-31 MED ORDER — DIBUCAINE (PERIANAL) 1 % EX OINT: 1.0000 | TOPICAL_OINTMENT | CUTANEOUS | Status: DC | PRN

## 2023-08-31 MED ORDER — ACETAMINOPHEN 325 MG PO TABS: 650.0000 mg | ORAL_TABLET | ORAL | Status: DC | PRN

## 2023-08-31 MED ORDER — SODIUM CHLORIDE 0.9 % IV SOLN: 250.0000 mL | INTRAVENOUS | Status: DC | PRN

## 2023-08-31 MED ORDER — SENNOSIDES-DOCUSATE SODIUM 8.6-50 MG PO TABS
2.0000 | ORAL_TABLET | ORAL | Status: DC
  Administered 2023-08-31 – 2023-09-01 (×2): 2 via ORAL
  Filled 2023-08-31 (×2): qty 2

## 2023-08-31 MED ORDER — PRENATAL MULTIVITAMIN CH
1.0000 | ORAL_TABLET | Freq: Every day | ORAL | Status: DC
  Administered 2023-08-31 – 2023-09-01 (×2): 1 via ORAL
  Filled 2023-08-31 (×3): qty 1

## 2023-08-31 MED ORDER — COCONUT OIL OIL
1.0000 | TOPICAL_OIL | Status: DC | PRN
  Administered 2023-09-01: 1 via TOPICAL

## 2023-08-31 MED ORDER — SODIUM CHLORIDE 0.9% FLUSH
3.0000 mL | INTRAVENOUS | Status: DC | PRN
Start: 2023-08-31 — End: 2023-09-01

## 2023-08-31 MED ORDER — ONDANSETRON HCL 4 MG/2ML IJ SOLN: 4.0000 mg | INTRAMUSCULAR | Status: DC | PRN

## 2023-08-31 MED ORDER — ZOLPIDEM TARTRATE 5 MG PO TABS: 5.0000 mg | ORAL_TABLET | Freq: Every evening | ORAL | Status: DC | PRN

## 2023-08-31 MED ORDER — SODIUM CHLORIDE 0.9% FLUSH
3.0000 mL | Freq: Two times a day (BID) | INTRAVENOUS | Status: DC
  Administered 2023-08-31: 3 mL via INTRAVENOUS

## 2023-08-31 MED ORDER — WITCH HAZEL-GLYCERIN EX PADS
1.0000 | MEDICATED_PAD | CUTANEOUS | Status: DC | PRN
Start: 2023-08-31 — End: 2023-09-01

## 2023-08-31 MED ORDER — SIMETHICONE 80 MG PO CHEW: 80.0000 mg | CHEWABLE_TABLET | ORAL | Status: DC | PRN

## 2023-08-31 MED ORDER — IBUPROFEN 600 MG PO TABS
600.0000 mg | ORAL_TABLET | Freq: Four times a day (QID) | ORAL | Status: DC
  Administered 2023-08-31 – 2023-09-01 (×5): 600 mg via ORAL
  Filled 2023-08-31 (×6): qty 1

## 2023-08-31 MED ORDER — CEFAZOLIN SODIUM-DEXTROSE 2-4 GM/100ML-% IV SOLN
2.0000 g | Freq: Once | INTRAVENOUS | Status: AC
  Administered 2023-08-31: 2 g via INTRAVENOUS
  Filled 2023-08-31: qty 100

## 2023-08-31 MED ORDER — BENZOCAINE-MENTHOL 20-0.5 % EX AERO
1.0000 | INHALATION_SPRAY | CUTANEOUS | Status: DC | PRN
  Administered 2023-09-01: 1 via TOPICAL
  Filled 2023-08-31: qty 56

## 2023-08-31 NOTE — Lactation Note (Signed)
 This note was copied from a baby's chart. Lactation Consultation Note  Patient Name: Julia Bryan Unijb'd Date: 08/31/2023 Age:23 hours Reason for consult: Initial assessment;Exclusive pumping and bottle feeding;Early term 19-38.6wks  Mother is currently only formula feeding.  Mother states she will call if she would like to be set up with DEBP.    Feeding Mother's Current Feeding Choice: Formula  Interventions Interventions: Education;LC Services brochure;CDC milk storage guidelines  Discharge Pump: Personal;Hands Free;DEBP  Consult Status Consult Status: PRN   Shannon Levorn Lemme  RN, IBCLC 08/31/2023, 11:39 AM

## 2023-08-31 NOTE — Anesthesia Postprocedure Evaluation (Signed)
 Anesthesia Post Note  Patient: Julia Bryan  Procedure(s) Performed: AN AD HOC LABOR EPIDURAL     Patient location during evaluation: Mother Baby Anesthesia Type: Epidural Level of consciousness: awake, oriented and awake and alert Pain management: pain level controlled Vital Signs Assessment: post-procedure vital signs reviewed and stable Respiratory status: spontaneous breathing, nonlabored ventilation and respiratory function stable Cardiovascular status: stable Postop Assessment: no headache, patient able to bend at knees, adequate PO intake, able to ambulate and no apparent nausea or vomiting Anesthetic complications: no   No notable events documented.  Last Vitals:  Vitals:   08/31/23 0347 08/31/23 0530  BP: 122/63 (!) 108/50  Pulse: 97 93  Resp: 17 16  Temp: 37.1 C   SpO2: 98% 96%    Last Pain:  Vitals:   08/31/23 0534  TempSrc:   PainSc: 0-No pain   Pain Goal:                   [name]

## 2023-08-31 NOTE — Discharge Summary (Signed)
 Postpartum Discharge Summary  Date of Service updated***     Patient Name: Julia Bryan DOB: 2000/10/11 MRN: [id number]  Date of admission: 08/30/2023 Delivery date:08/31/2023 Delivering provider: CHUBB, CASEY C Date of discharge: 08/31/2023  Admitting diagnosis: Pregnancy [Z34.90] Intrauterine pregnancy: [redacted]w[redacted]d     Secondary diagnosis:  Principal Problem:   Gestational diabetes Active Problems:   Supervision of high risk pregnancy   Alpha thalassemia silent carrier   Macrosomia   Pregnancy  Additional problems: ***    Discharge diagnosis: Term Pregnancy Delivered and GDM A2                                              Post partum procedures:{Postpartum procedures:23558} Augmentation: AROM and Pitocin  Complications: None  Hospital course: Induction of Labor With Vaginal Delivery   23 y.o. yo G3P1011 at [redacted]w[redacted]d was admitted to the hospital 08/30/2023 for induction of labor.  Indication for induction: A2 DM.  Patient had an labor course that was uncomplicated. Membrane Rupture Time/Date: 4:16 PM,08/30/2023  Delivery Method:Vaginal, Spontaneous Operative Delivery:N/A Episiotomy: None Lacerations:  None Details of delivery can be found in separate delivery note.  Patient had a postpartum course complicated by***. Patient is discharged home 08/31/23.  Newborn Data: Birth date:08/31/2023 Birth time:1:46 AM Gender:Female Living status:Living Apgars:9 ,9  Weight:   Magnesium Sulfate received: {Mag received:30440022} BMZ received: No Rhophylac:N/A MMR:N/A T-DaP:Given prenatally Flu: No RSV Vaccine received: No Transfusion:{Transfusion received:30440034}  Immunizations received: Immunization History  Administered Date(s) Administered   Influenza, Seasonal, Injecte, Preservative Fre 02/18/2023   Tdap 08/11/2023    Physical exam  Vitals:   08/31/23 0046 08/31/23 0155 08/31/23 0204 08/31/23 0215  BP:  (!) 115/52 99/83 115/67  Pulse:  (!) 105  (!) 106  Resp:       Temp: 98.2 F (36.8 C)     TempSrc: Oral     SpO2:      Weight:      Height:       General: {Exam; general:21111117} Lochia: {Desc; appropriate/inappropriate:30686::appropriate} Uterine Fundus: {Desc; firm/soft:30687} Incision: {Exam; incision:21111123} DVT Evaluation: {Exam; dvt:2111122} Labs: Lab Results  Component Value Date   WBC 6.5 08/30/2023   HGB 8.4 (L) 08/30/2023   HCT 27.0 (L) 08/30/2023   MCV 77.1 (L) 08/30/2023   PLT 313 08/30/2023       No data to display         Edinburgh Score:    11/27/2019   10:33 AM  Edinburgh Postnatal Depression Scale Screening Tool  I have been able to laugh and see the funny side of things. 0  I have looked forward with enjoyment to things. 0  I have blamed myself unnecessarily when things went wrong. 2  I have been anxious or worried for no good reason. 2  I have felt scared or panicky for no good reason. 2  Things have been getting on top of me. 1  I have been so unhappy that I have had difficulty sleeping. 0  I have felt sad or miserable. 0  I have been so unhappy that I have been crying. 0  The thought of harming myself has occurred to me. 0  Edinburgh Postnatal Depression Scale Total 7      Data saved with a previous flowsheet row definition   No data recorded  After visit meds:  Allergies as of 08/31/2023  No Known Allergies   Med Rec must be completed prior to using this [facility]***        Discharge home in stable condition Infant Feeding: {Baby feeding:23562} Infant Disposition:{CHL IP OB HOME WITH FNUYZM:76418} Discharge instruction: per After Visit Summary and Postpartum booklet. Activity: Advance as tolerated. Pelvic rest for 6 weeks.  Diet: {OB ipzu:78888878} Future Appointments:No future appointments. Follow up Visit: Message to [facility] 7/28  Please schedule this patient for a In person postpartum visit in 4 weeks with the following provider: Any provider. Additional Postpartum F/U:2 hour GTT   High risk pregnancy complicated by: GDM Delivery mode:  Vaginal, Spontaneous Anticipated Birth Control:  IUD @ PP visit    08/31/2023 [name], MD

## 2023-09-01 MED ORDER — FERROUS SULFATE 325 (65 FE) MG PO TABS: 325.0000 mg | ORAL_TABLET | ORAL | 0 refills | Status: AC

## 2023-09-01 MED ORDER — SENNA 8.6 MG PO TABS: 2.0000 | ORAL_TABLET | Freq: Every evening | ORAL | 0 refills | Status: AC | PRN

## 2023-09-01 MED ORDER — ACETAMINOPHEN 500 MG PO TABS
1000.0000 mg | ORAL_TABLET | Freq: Three times a day (TID) | ORAL | 0 refills | Status: AC | PRN
Start: 2023-09-01 — End: ?

## 2023-09-01 MED ORDER — IBUPROFEN 800 MG PO TABS: 800.0000 mg | ORAL_TABLET | Freq: Three times a day (TID) | ORAL | 0 refills | Status: AC | PRN

## 2023-09-01 NOTE — Patient Instructions (Signed)
 If interested in an outpatient lactation consult in office or virtually please reach out to us  at [facility] (First Floor) [address], [place] Please call [phone] and press 4 for lactation.   -- Lactation support groups:  [facility], Tuesdays 10:00 am -12:00 pm at [address] on the second floor in the conference room, lactating parents and lap babies welcome.  [email or web address]  [email or web address]     Julia Bryan, [facility] Center for [facility]

## 2023-09-02 NOTE — Progress Notes (Signed)
 Error. See NST note.

## 2023-09-09 ENCOUNTER — Encounter: Admitting: Obstetrics & Gynecology

## 2023-09-16 ENCOUNTER — Telehealth (HOSPITAL_COMMUNITY): Payer: Self-pay | Admitting: *Deleted

## 2023-09-16 NOTE — Telephone Encounter (Signed)
 09/16/2023  Name: Julia Bryan MRN: [id number] DOB: 05/17/2000  Reason for Call:  Transition of Care Hospital Discharge Call  Contact Status: Patient Contact Status: Complete  Language assistant needed:          Follow-Up Questions: Do You Have Any Concerns About Your Health As You Heal From Delivery?: No Do You Have Any Concerns About Your Infants Health?: No  Edinburgh Postnatal Depression Scale:  In the Past 7 Days: I have been able to laugh and see the funny side of things.: As much as I always could I have looked forward with enjoyment to things.: As much as I ever did I have blamed myself unnecessarily when things went wrong.: Not very often I have been anxious or worried for no good reason.: Hardly ever I have felt scared or panicky for no good reason.: No, not at all Things have been getting on top of me.: No, most of the time I have coped quite well I have been so unhappy that I have had difficulty sleeping.: Not at all I have felt sad or miserable.: No, not at all I have been so unhappy that I have been crying.: No, never The thought of harming myself has occurred to me.: Never [place] Postnatal Depression Scale Total: 3  PHQ2-9 Depression Scale:     Discharge Follow-up: Edinburgh score requires follow up?: No Patient was advised of the following resources:: Breastfeeding Support Group, Support Group Requested email information - sent by RN. Post-discharge interventions: Reviewed Newborn Safe Sleep Practices  Signature Allean IVAR Carton, RN, 09/16/23, [phone]

## 2023-09-28 ENCOUNTER — Other Ambulatory Visit: Payer: Self-pay | Admitting: Obstetrics and Gynecology

## 2023-10-13 ENCOUNTER — Other Ambulatory Visit: Payer: Self-pay

## 2023-10-13 DIAGNOSIS — O24419 Gestational diabetes mellitus in pregnancy, unspecified control: Secondary | ICD-10-CM

## 2023-10-16 ENCOUNTER — Other Ambulatory Visit

## 2023-10-16 ENCOUNTER — Other Ambulatory Visit: Payer: Self-pay

## 2023-10-16 ENCOUNTER — Encounter: Payer: Self-pay | Admitting: Obstetrics and Gynecology

## 2023-10-16 ENCOUNTER — Ambulatory Visit: Admitting: Obstetrics and Gynecology

## 2023-10-16 DIAGNOSIS — O24419 Gestational diabetes mellitus in pregnancy, unspecified control: Secondary | ICD-10-CM

## 2023-10-16 DIAGNOSIS — Z23 Encounter for immunization: Secondary | ICD-10-CM

## 2023-10-16 DIAGNOSIS — Z30430 Encounter for insertion of intrauterine contraceptive device: Secondary | ICD-10-CM | POA: Diagnosis not present

## 2023-10-16 DIAGNOSIS — Z3202 Encounter for pregnancy test, result negative: Secondary | ICD-10-CM | POA: Diagnosis not present

## 2023-10-16 LAB — POCT PREGNANCY, URINE: Preg Test, Ur: NEGATIVE

## 2023-10-16 MED ORDER — LEVONORGESTREL 20 MCG/DAY IU IUD
1.0000 | INTRAUTERINE_SYSTEM | Freq: Once | INTRAUTERINE | Status: AC
  Administered 2023-10-16: 1 via INTRAUTERINE

## 2023-10-16 NOTE — Patient Instructions (Signed)

## 2023-10-16 NOTE — Progress Notes (Signed)
 Post Partum Visit Note  Julia Bryan is a 23 y.o. G35P2012 female who presents for a postpartum visit. She is 6 weeks postpartum following a normal spontaneous vaginal delivery.  I have fully reviewed the prenatal and intrapartum course. The delivery was at 38/1 gestational weeks.  Anesthesia: epidural. Postpartum course has been good. Baby is doing well. Baby is feeding by bottle - Similac Advance. Bleeding no bleeding. Bowel function is normal. Bladder function is normal. Patient is not sexually active. Contraception method is none. Postpartum depression screening: negative.   The pregnancy intention screening data noted above was reviewed. Potential methods of contraception were discussed. The patient elected to proceed with No data recorded.   Edinburgh Postnatal Depression Scale - 10/16/23 1007       Edinburgh Postnatal Depression Scale:  In the Past 7 Days   I have been able to laugh and see the funny side of things. 0    I have looked forward with enjoyment to things. 0    I have blamed myself unnecessarily when things went wrong. 0    I have been anxious or worried for no good reason. 0    I have felt scared or panicky for no good reason. 0    Things have been getting on top of me. 0    I have been so unhappy that I have had difficulty sleeping. 0    I have felt sad or miserable. 0    I have been so unhappy that I have been crying. 0    The thought of harming myself has occurred to me. 0    Edinburgh Postnatal Depression Scale Total 0          Health Maintenance Due  Topic Date Due   HPV VACCINES (1 - 3-dose series) Never done   Meningococcal B Vaccine (1 of 2 - Standard) Never done   Pneumococcal Vaccine (1 of 2 - PCV) Never done   Hepatitis B Vaccines 19-59 Average Risk (1 of 3 - 19+ 3-dose series) Never done   Influenza Vaccine  09/04/2023   COVID-19 Vaccine (1 - 2024-25 season) Never done    The following portions of the patient's history were reviewed and updated  as appropriate: allergies, current medications, past family history, past medical history, past social history, past surgical history, and problem list.  Review of Systems Pertinent items are noted in HPI.  Objective:  BP 109/70   Pulse 65   Ht (!) 5 (0.127 m)   Wt 144 lb 8 oz (65.5 kg)   LMP 11/29/2022 (Exact Date)   Breastfeeding No   BMI 4063.78 kg/m    General:  alert and cooperative   Breasts:  not indicated  Lungs: Normal effort  Heart:  Normal rate  Abdomen: nondistended   =   GU exam:  normal, see below       IUD Insertion Procedure Note Patient identified, informed consent performed, consent signed.   Discussed risks of irregular bleeding, cramping, infection, malpositioning or misplacement of the IUD outside the uterus which may require further procedure such as laparoscopy. Time out was performed.  Urine pregnancy test negative.  Speculum placed in the vagina.  Cervix visualized.  Cleaned with Betadine x 2.  Grasped anteriorly with a single tooth tenaculum.  Uterus sounded to 6 cm.  Mirena  IUD placed per manufacturer's recommendations.  Strings trimmed to 3 cm. Tenaculum was removed, good hemostasis noted.  Patient tolerated procedure well.   Patient was given  post-procedure instructions.  She was advised to have backup contraception for one week.  Patient was also asked to check IUD strings periodically and follow up in 4 weeks for IUD check.  Jasmine Glass, NP-S, observed by Nidia Daring, FNP   Assessment:  1. Postpartum exam (Primary)   2. Need for vaccination  - Flu vaccine trivalent PF, 6mos and older(Flulaval,Afluria,Fluarix,Fluzone)  3. Encounter for IUD insertion    Plan:   Essential components of care per ACOG recommendations:  1.  Mood and well being: Patient with negative depression screening today. Reviewed local resources for support.  - Patient tobacco use? No.   - hx of drug use? No.    2. Infant care and feeding:  -Patient currently  breastmilk feeding? No.  -Social determinants of health (SDOH) reviewed in EPIC. No concerns  3. Sexuality, contraception and birth spacing - Patient does not want a pregnancy in the next year.  - Reviewed reproductive life planning. Reviewed contraceptive methods based on pt preferences and effectiveness.  Patient desired IUD or IUS today.   - Discussed birth spacing of 18 months  4. Sleep and fatigue -Encouraged family/partner/community support of 4 hrs of uninterrupted sleep to help with mood and fatigue  5. Physical Recovery  - Discussed patients delivery and complications. She describes her labor as good. - Patient had a Vaginal, no problems at delivery. Patient had a none laceration. Perineal healing reviewed. Patient expressed understanding - Patient has urinary incontinence? No. - Patient is safe to resume physical and sexual activity  6.  Health Maintenance - HM due items addressed Yes - Last pap smear  Diagnosis  Date Value Ref Range Status  02/18/2023   Final   - Negative for intraepithelial lesion or malignancy (NILM)   Pap smear not done at today's visit.  -Breast Cancer screening indicated? No.   7. Chronic Disease/Pregnancy Condition follow up: Gestational Diabetes  - PCP follow up  Nidia Daring, FNP Center for [facility], [facility]

## 2023-10-16 NOTE — Addendum Note (Signed)
 Addended by: ROSALYNN SHARLET RAMAN on: 10/16/2023 12:23 PM   Modules accepted: Orders

## 2023-10-17 LAB — GLUCOSE TOLERANCE, 2 HOURS
Glucose, 2 hour: 116 mg/dL (ref 70–139)
Glucose, GTT - Fasting: 83 mg/dL (ref 70–99)

## 2023-10-18 ENCOUNTER — Ambulatory Visit: Payer: Self-pay | Admitting: Obstetrics and Gynecology

## 2023-11-16 ENCOUNTER — Ambulatory Visit: Admitting: Obstetrics and Gynecology

## 2023-12-10 ENCOUNTER — Other Ambulatory Visit: Payer: Self-pay

## 2023-12-10 ENCOUNTER — Ambulatory Visit: Admitting: Obstetrics and Gynecology

## 2023-12-10 ENCOUNTER — Encounter: Payer: Self-pay | Admitting: Obstetrics and Gynecology

## 2023-12-10 VITALS — BP 109/69 | HR 78 | Wt 164.2 lb

## 2023-12-10 DIAGNOSIS — Z975 Presence of (intrauterine) contraceptive device: Secondary | ICD-10-CM | POA: Diagnosis not present

## 2023-12-10 NOTE — Progress Notes (Signed)
    GYNECOLOGY VISIT  Patient name: Julia Bryan MRN [id number]  Date of birth: 11-14-2000 Chief Complaint:   Gynecologic Exam  History:  Julia Bryan  here for IUD string check. Mirena  inserted at [facility] visit. Just bottle feeding, first IUD inserted. Not soaking through menstrual products but has been pretty heavy. Had no bleeding for about 4 days and then returned. Not too bothered by the bleeding.    The following portions of the patient's history were reviewed and updated as appropriate: allergies, current medications, past family history, past medical history, past social history, past surgical history and problem list.   Health Maintenance:   Last pap     Component Value Date/Time   DIAGPAP  02/18/2023 1018    - Negative for intraepithelial lesion or malignancy (NILM)   ADEQPAP Satisfactory for evaluation.  The absence of an 02/18/2023 1018   ADEQPAP  02/18/2023 1018    endocervical/transformation zone component is not uncommon in pregnant   ADEQPAP patients. 02/18/2023 1018    Health Maintenance  Topic Date Due   HPV Vaccine (1 - 3-dose series) Never done   Meningitis B Vaccine (1 of 2 - Standard) Never done   Pneumococcal Vaccine (1 of 2 - PCV) Never done   Hepatitis B Vaccine (1 of 3 - 19+ 3-dose series) Never done   COVID-19 Vaccine (1 - 2025-26 season) Never done   Chlamydia screening  08/17/2024   Pap Smear  02/17/2026   DTaP/Tdap/Td vaccine (2 - Td or Tdap) 08/10/2033   Flu Shot  Completed   Hepatitis C Screening  Completed   HIV Screening  Completed      Review of Systems:  Pertinent items are noted in HPI. Comprehensive review of systems was otherwise negative.   Objective:  Physical Exam BP 109/69   Pulse 78   Wt 164 lb 3.2 oz (74.5 kg)   BMI 4617.81 kg/m    Physical Exam Vitals and nursing note reviewed. Exam conducted with a chaperone present.  Constitutional:      Appearance: Normal appearance.  HENT:     Head: Normocephalic and atraumatic.   Pulmonary:     Effort: Pulmonary effort is normal.  Genitourinary:    General: Normal vulva.     Comments: IUD strings visualized, not extruding from cervix  Skin:    General: Skin is warm and dry.  Neurological:     General: No focal deficit present.     Mental Status: She is alert.  Psychiatric:        Mood and Affect: Mood normal.        Behavior: Behavior normal.        Thought Content: Thought content normal.        Judgment: Judgment normal.       Assessment & Plan:  1. IUD (intrauterine device) in place (Primary) IUD In place. Will wait it out for now. Discussed that there can be abnormal bleeding for up to 6 months and if bothersome can either treat bleeding with medication or consider switching contraceptive methods altogether. Can also consider pelvic US  to better assess position.      Carter Quarry, MD Minimally Invasive Gynecologic Surgery Center for [facility], [facility]
